# Patient Record
Sex: Female | Born: 1945 | Race: White | Hispanic: No | Smoking: Current every day smoker
Health system: Southern US, Community
[De-identification: ages and names within clinical notes are randomized; demographics above are authoritative.]

## PROBLEM LIST (undated history)

## (undated) DIAGNOSIS — I10 Essential (primary) hypertension: Secondary | ICD-10-CM

## (undated) DIAGNOSIS — J449 Chronic obstructive pulmonary disease, unspecified: Secondary | ICD-10-CM

## (undated) HISTORY — PX: CHOLECYSTECTOMY: SHX55

## (undated) HISTORY — PX: NECK SURGERY: SHX720

## (undated) HISTORY — PX: COLONOSCOPY: SHX174

---

## 2011-02-21 DIAGNOSIS — J449 Chronic obstructive pulmonary disease, unspecified: Secondary | ICD-10-CM | POA: Diagnosis present

## 2011-02-28 DIAGNOSIS — E785 Hyperlipidemia, unspecified: Secondary | ICD-10-CM | POA: Diagnosis present

## 2011-09-27 DIAGNOSIS — K5730 Diverticulosis of large intestine without perforation or abscess without bleeding: Secondary | ICD-10-CM | POA: Insufficient documentation

## 2012-08-16 DIAGNOSIS — Z22322 Carrier or suspected carrier of Methicillin resistant Staphylococcus aureus: Secondary | ICD-10-CM | POA: Insufficient documentation

## 2012-11-17 DIAGNOSIS — F411 Generalized anxiety disorder: Secondary | ICD-10-CM | POA: Insufficient documentation

## 2016-05-06 DIAGNOSIS — G459 Transient cerebral ischemic attack, unspecified: Secondary | ICD-10-CM | POA: Insufficient documentation

## 2016-05-06 DIAGNOSIS — N1832 Chronic kidney disease, stage 3b: Secondary | ICD-10-CM | POA: Diagnosis present

## 2017-04-14 DIAGNOSIS — H16223 Keratoconjunctivitis sicca, not specified as Sjogren's, bilateral: Secondary | ICD-10-CM | POA: Insufficient documentation

## 2017-08-30 DIAGNOSIS — H9313 Tinnitus, bilateral: Secondary | ICD-10-CM | POA: Insufficient documentation

## 2017-08-30 DIAGNOSIS — K219 Gastro-esophageal reflux disease without esophagitis: Secondary | ICD-10-CM | POA: Diagnosis present

## 2018-09-13 DIAGNOSIS — M47816 Spondylosis without myelopathy or radiculopathy, lumbar region: Secondary | ICD-10-CM | POA: Insufficient documentation

## 2019-03-19 DIAGNOSIS — I5189 Other ill-defined heart diseases: Secondary | ICD-10-CM

## 2019-04-12 DIAGNOSIS — I889 Nonspecific lymphadenitis, unspecified: Secondary | ICD-10-CM | POA: Insufficient documentation

## 2019-04-12 DIAGNOSIS — F17210 Nicotine dependence, cigarettes, uncomplicated: Secondary | ICD-10-CM | POA: Diagnosis present

## 2019-10-22 DIAGNOSIS — H353131 Nonexudative age-related macular degeneration, bilateral, early dry stage: Secondary | ICD-10-CM | POA: Insufficient documentation

## 2020-02-04 DIAGNOSIS — R221 Localized swelling, mass and lump, neck: Secondary | ICD-10-CM | POA: Insufficient documentation

## 2020-05-14 DIAGNOSIS — K118 Other diseases of salivary glands: Secondary | ICD-10-CM | POA: Insufficient documentation

## 2021-05-20 DIAGNOSIS — I2510 Atherosclerotic heart disease of native coronary artery without angina pectoris: Secondary | ICD-10-CM | POA: Diagnosis present

## 2021-11-04 DIAGNOSIS — J40 Bronchitis, not specified as acute or chronic: Secondary | ICD-10-CM | POA: Insufficient documentation

## 2021-11-10 ENCOUNTER — Other Ambulatory Visit: Payer: Self-pay

## 2021-11-10 ENCOUNTER — Encounter (HOSPITAL_BASED_OUTPATIENT_CLINIC_OR_DEPARTMENT_OTHER): Payer: Self-pay | Admitting: Urology

## 2021-11-10 ENCOUNTER — Emergency Department (HOSPITAL_BASED_OUTPATIENT_CLINIC_OR_DEPARTMENT_OTHER): Payer: Medicare HMO

## 2021-11-10 ENCOUNTER — Emergency Department (HOSPITAL_BASED_OUTPATIENT_CLINIC_OR_DEPARTMENT_OTHER)
Admission: EM | Admit: 2021-11-10 | Discharge: 2021-11-11 | Disposition: A | Discharge status: Home / Self Care | Payer: Medicare HMO | Attending: Emergency Medicine | Admitting: Emergency Medicine

## 2021-11-10 DIAGNOSIS — R0602 Shortness of breath: Secondary | ICD-10-CM | POA: Diagnosis present

## 2021-11-10 DIAGNOSIS — D649 Anemia, unspecified: Secondary | ICD-10-CM | POA: Diagnosis not present

## 2021-11-10 DIAGNOSIS — J449 Chronic obstructive pulmonary disease, unspecified: Secondary | ICD-10-CM | POA: Insufficient documentation

## 2021-11-10 DIAGNOSIS — Z7901 Long term (current) use of anticoagulants: Secondary | ICD-10-CM | POA: Diagnosis not present

## 2021-11-10 DIAGNOSIS — I10 Essential (primary) hypertension: Secondary | ICD-10-CM | POA: Diagnosis not present

## 2021-11-10 DIAGNOSIS — R072 Precordial pain: Secondary | ICD-10-CM | POA: Diagnosis not present

## 2021-11-10 DIAGNOSIS — E871 Hypo-osmolality and hyponatremia: Secondary | ICD-10-CM | POA: Diagnosis not present

## 2021-11-10 DIAGNOSIS — I480 Paroxysmal atrial fibrillation: Secondary | ICD-10-CM | POA: Diagnosis not present

## 2021-11-10 DIAGNOSIS — R002 Palpitations: Secondary | ICD-10-CM

## 2021-11-10 HISTORY — DX: Chronic obstructive pulmonary disease, unspecified: J44.9

## 2021-11-10 HISTORY — DX: Essential (primary) hypertension: I10

## 2021-11-10 LAB — CBC
HCT: 24 % — ABNORMAL LOW (ref 36.0–46.0)
Hemoglobin: 8.2 g/dL — ABNORMAL LOW (ref 12.0–15.0)
MCH: 32.5 pg (ref 26.0–34.0)
MCHC: 34.2 g/dL (ref 30.0–36.0)
MCV: 95.2 fL (ref 80.0–100.0)
Platelets: 339 10*3/uL (ref 150–400)
RBC: 2.52 MIL/uL — ABNORMAL LOW (ref 3.87–5.11)
RDW: 15.9 % — ABNORMAL HIGH (ref 11.5–15.5)
WBC: 10.6 10*3/uL — ABNORMAL HIGH (ref 4.0–10.5)
nRBC: 0 % (ref 0.0–0.2)

## 2021-11-10 LAB — BASIC METABOLIC PANEL
Anion gap: 6 (ref 5–15)
BUN: 19 mg/dL (ref 8–23)
CO2: 22 mmol/L (ref 22–32)
Calcium: 8.5 mg/dL — ABNORMAL LOW (ref 8.9–10.3)
Chloride: 99 mmol/L (ref 98–111)
Creatinine, Ser: 1.91 mg/dL — ABNORMAL HIGH (ref 0.44–1.00)
GFR, Estimated: 27 mL/min — ABNORMAL LOW (ref >60)
Glucose, Bld: 95 mg/dL (ref 70–99)
Potassium: 4.3 mmol/L (ref 3.5–5.1)
Sodium: 127 mmol/L — ABNORMAL LOW (ref 135–145)

## 2021-11-10 LAB — MAGNESIUM: Magnesium: 2.1 mg/dL (ref 1.7–2.4)

## 2021-11-10 LAB — TROPONIN I (HIGH SENSITIVITY)
Troponin I (High Sensitivity): 7 ng/L (ref <18)
Troponin I (High Sensitivity): 7 ng/L (ref <18)

## 2021-11-10 MED ORDER — SODIUM CHLORIDE 0.9 % IV BOLUS
500.0000 mL | Freq: Once | INTRAVENOUS | Status: AC
  Administered 2021-11-10: 500 mL via INTRAVENOUS

## 2021-11-10 NOTE — ED Triage Notes (Signed)
Central chest pain and SOB x 45 minutes  Worse with exertion  States "felt faint when walking"  States pain radiating to back and jaw H/o irregualar HR , takes eliquis, started within the last month  States bilateral leg pain

## 2021-11-10 NOTE — ED Notes (Signed)
Patient transported to X-ray 

## 2021-11-11 NOTE — ED Provider Notes (Signed)
Emergency Department Provider Note   I have reviewed the triage vital signs and the nursing notes.   HISTORY  Chief Complaint Chest Pain   HPI Kristina Olson is a 76 y.o. female with PMH of COPD, HTN, and PAF on Eliquis presents to the ED with central chest discomfort, shortness of breath, heart palpitations.  Symptoms recurred today but notes that she has had similar episodes in the recent past.  She was diagnosed with a cardiac arrhythmia, she believes to be A-fib, and it feels similar to today's symptoms.  She not actively having any discomfort.  She is compliant with her Eliquis.  She states often when she is up and walking she feels weakness in the legs and then palpitations and discomfort develop in the chest.  No prior history of ACS.    Past Medical History:  Diagnosis Date   COPD (chronic obstructive pulmonary disease) ([place])    Hypertension     Review of Systems  Constitutional: No fever/chills Eyes: No visual changes. ENT: No sore throat. Cardiovascular: Positive chest pain w/ palpitations.  Respiratory: Positive shortness of breath. Gastrointestinal: No abdominal pain.  No nausea, no vomiting.  No diarrhea.  No constipation. Musculoskeletal: Negative for back pain. Skin: Negative for rash. Neurological: Negative for headaches.  ____________________________________________   PHYSICAL EXAM:  VITAL SIGNS: ED Triage Vitals  Enc Vitals Group     BP 11/10/21 2120 (!) 143/71     Pulse Rate 11/10/21 2120 88     Resp 11/10/21 2120 20     Temp 11/10/21 2120 98.9 F (37.2 C)     Temp Source 11/10/21 2120 Oral     SpO2 11/10/21 2120 98 %     Weight 11/10/21 2119 115 lb (52.2 kg)     Height 11/10/21 2119 5\' 2"  (1.575 m)   Constitutional: Alert and oriented. Well appearing and in no acute distress. Eyes: Conjunctivae are normal.  Head: Atraumatic. Nose: No congestion/rhinnorhea. Mouth/Throat: Mucous membranes are moist.   Neck: No stridor.   Cardiovascular:  Normal rate, regular rhythm. Good peripheral circulation. Grossly normal heart sounds. 2+ DP and PT pulses bilaterally. Respiratory: Normal respiratory effort.  No retractions. Lungs CTAB. No wheezing.  Gastrointestinal: Soft and nontender. No distention.  Musculoskeletal: No lower extremity tenderness nor edema. No gross deformities of extremities. Neurologic:  Normal speech and language. No gross focal neurologic deficits are appreciated.  Skin:  Skin is warm, dry and intact. No rash noted.   ____________________________________________   LABS (all labs ordered are listed, but only abnormal results are displayed)  Labs Reviewed  BASIC METABOLIC PANEL - Abnormal; Notable for the following components:      Result Value   Sodium 127 (*)    Creatinine, Ser 1.91 (*)    Calcium 8.5 (*)    GFR, Estimated 27 (*)    All other components within normal limits  CBC - Abnormal; Notable for the following components:   WBC 10.6 (*)    RBC 2.52 (*)    Hemoglobin 8.2 (*)    HCT 24.0 (*)    RDW 15.9 (*)    All other components within normal limits  MAGNESIUM  TROPONIN I (HIGH SENSITIVITY)  TROPONIN I (HIGH SENSITIVITY)   ____________________________________________  EKG   EKG Interpretation  Date/Time:  Wednesday Nov 10 2021 21:26:53 EDT Ventricular Rate:  82 PR Interval:  184 QRS Duration: 80 QT Interval:  390 QTC Calculation: 455 R Axis:   73 Text Interpretation: Normal sinus rhythm Normal  ECG No previous ECGs available Confirmed by Nanda Quinton [phone]) on 11/10/2021 10:41:02 PM        ____________________________________________  RADIOLOGY  DG Chest 2 View  Result Date: 11/10/2021 CLINICAL DATA:  chest pain EXAM: CHEST - 2 VIEW COMPARISON:  Chest x-ray 11/04/2021, CT chest 05/17/2021 FINDINGS: The heart and mediastinal contours are unchanged. Aortic calcification. Biapical pleural/pulmonary scarring. No focal consolidation. No pulmonary edema. No pleural effusion. No  pneumothorax. No acute osseous abnormality. IMPRESSION: 1. No active cardiopulmonary disease. 2.  Aortic Atherosclerosis (ICD10-I70.0). Electronically Signed   By: Iven Finn M.D.   On: 11/10/2021 21:50    ____________________________________________   PROCEDURES  Procedure(s) performed:   Procedures  None  ____________________________________________   INITIAL IMPRESSION / ASSESSMENT AND PLAN / ED COURSE  Pertinent labs & imaging results that were available during my care of the patient were reviewed by me and considered in my medical decision making (see chart for details).   This patient is Presenting for Evaluation of CP, which does require a range of treatment options, and is a complaint that involves a high risk of morbidity and mortality.  The Differential Diagnoses includes but is not exclusive to acute coronary syndrome, aortic dissection, pulmonary embolism, cardiac tamponade, community-acquired pneumonia, pericarditis, musculoskeletal chest wall pain, etc.   Critical Interventions-    Medications  sodium chloride 0.9 % bolus 500 mL (0 mLs Intravenous Stopped 11/11/21 0053)    Reassessment after intervention: Patient feeling well.    I decided to review pertinent External Data, and in summary patient followed primarily in the [facility] system. Most recent CBC and chemistry evaluated. .   Clinical Laboratory Tests Ordered, included patient's labs are consistent with creatinine of 1.91.  Noted to have CKD prior labs from the [facility] system.  Very mild hyponatremia at 127.  Troponin is negative x2.  Hemoglobin of 8.2 downtrending slightly from 9 labs from earlier this month.   Radiologic Tests Ordered, included CXR. I independently interpreted the images and agree with radiology interpretation.   Cardiac Monitor Tracing which shows NSR. No ectopy. No a fib.   Medical Decision Making: Summary: Patient presents emergency department intermittent palpitation,  weakness, shortness of breath symptoms.  She has associated chest discomfort and notes that this similar to prior episodes of arrhythmia.  Low suspicion overall for ACS although patient does have several risk factors.  Her troponin is negative x2 and she is not having any active pain.   Reevaluation with update and discussion with patient.  We discussed her lab results and need for close follow-up regarding her anemia.  She is feeling much better after IV fluids.  Does not appear acutely volume overloaded to suspect CHF leading to her hyponatremia.  She is not symptomatic from her hyponatremia.  I feel both of these issues can be addressed as an outpatient.   Considered admission but patient has close follow-up with her PCP in the [facility] along with a cardiologist.  We discussed strict ED return precautions the patient feels comfortable with discharge home and close follow-up.   Disposition: discharge  ____________________________________________  FINAL CLINICAL IMPRESSION(S) / ED DIAGNOSES  Final diagnoses:  Precordial chest pain  Palpitations  Hyponatremia  Anemia, unspecified type    Note:  This document was prepared using Dragon voice recognition software and may include unintentional dictation errors.  Nanda Quinton, MD, [facility] Emergency Medicine    Renalda Locklin, [facility], MD 11/11/21 [phone]

## 2021-11-11 NOTE — ED Notes (Signed)
Pt A&OX4 ambulatory at d/c with independent steady gait. Pt is calling her son to come pick her up. Pt verbalized understanding of d/c instructions and follow up care.

## 2021-11-11 NOTE — Discharge Instructions (Signed)
You were seen in the emergency department today with chest discomfort.  Your lab work today shows slightly low sodium levels, possibly from dehydration.  I would like for you to follow with your primary care doctor for repeat lab work in the next 1 week.  They can check to see this is returned to normal.  Your hemoglobin or red blood cell levels were also low but not to the point of requiring a blood transfusion.  Your primary care doctor will need to repeat these blood labs as well.  If this continues to drop lower you may feel fatigue symptoms or require blood transfusion.   Please call your cardiologist in the morning to discuss your symptoms today and schedule a close follow-up with them as well.  Return to the emergency department with any new or suddenly worsening symptoms.

## 2021-11-19 DIAGNOSIS — D500 Iron deficiency anemia secondary to blood loss (chronic): Secondary | ICD-10-CM | POA: Insufficient documentation

## 2021-11-27 ENCOUNTER — Other Ambulatory Visit: Payer: Self-pay

## 2021-11-27 ENCOUNTER — Inpatient Hospital Stay (HOSPITAL_BASED_OUTPATIENT_CLINIC_OR_DEPARTMENT_OTHER)
Admission: EM | Admit: 2021-11-27 | Discharge: 2021-11-29 | DRG: 378 | Disposition: A | Discharge status: Home / Self Care | Payer: Medicare HMO | Attending: Internal Medicine | Admitting: Internal Medicine

## 2021-11-27 ENCOUNTER — Emergency Department (HOSPITAL_BASED_OUTPATIENT_CLINIC_OR_DEPARTMENT_OTHER): Payer: Medicare HMO

## 2021-11-27 ENCOUNTER — Encounter (HOSPITAL_BASED_OUTPATIENT_CLINIC_OR_DEPARTMENT_OTHER): Payer: Self-pay | Admitting: Emergency Medicine

## 2021-11-27 DIAGNOSIS — R0602 Shortness of breath: Secondary | ICD-10-CM | POA: Diagnosis present

## 2021-11-27 DIAGNOSIS — Z7982 Long term (current) use of aspirin: Secondary | ICD-10-CM

## 2021-11-27 DIAGNOSIS — Z5329 Procedure and treatment not carried out because of patient's decision for other reasons: Secondary | ICD-10-CM | POA: Diagnosis not present

## 2021-11-27 DIAGNOSIS — E871 Hypo-osmolality and hyponatremia: Secondary | ICD-10-CM | POA: Diagnosis present

## 2021-11-27 DIAGNOSIS — Z8719 Personal history of other diseases of the digestive system: Secondary | ICD-10-CM

## 2021-11-27 DIAGNOSIS — I129 Hypertensive chronic kidney disease with stage 1 through stage 4 chronic kidney disease, or unspecified chronic kidney disease: Secondary | ICD-10-CM | POA: Diagnosis present

## 2021-11-27 DIAGNOSIS — Z7901 Long term (current) use of anticoagulants: Secondary | ICD-10-CM

## 2021-11-27 DIAGNOSIS — Z8249 Family history of ischemic heart disease and other diseases of the circulatory system: Secondary | ICD-10-CM | POA: Diagnosis not present

## 2021-11-27 DIAGNOSIS — Z9049 Acquired absence of other specified parts of digestive tract: Secondary | ICD-10-CM

## 2021-11-27 DIAGNOSIS — K219 Gastro-esophageal reflux disease without esophagitis: Secondary | ICD-10-CM | POA: Diagnosis present

## 2021-11-27 DIAGNOSIS — D539 Nutritional anemia, unspecified: Secondary | ICD-10-CM | POA: Diagnosis present

## 2021-11-27 DIAGNOSIS — K921 Melena: Secondary | ICD-10-CM | POA: Diagnosis not present

## 2021-11-27 DIAGNOSIS — I480 Paroxysmal atrial fibrillation: Secondary | ICD-10-CM | POA: Diagnosis present

## 2021-11-27 DIAGNOSIS — K2100 Gastro-esophageal reflux disease with esophagitis, without bleeding: Secondary | ICD-10-CM | POA: Diagnosis not present

## 2021-11-27 DIAGNOSIS — Z801 Family history of malignant neoplasm of trachea, bronchus and lung: Secondary | ICD-10-CM

## 2021-11-27 DIAGNOSIS — K580 Irritable bowel syndrome with diarrhea: Secondary | ICD-10-CM | POA: Diagnosis present

## 2021-11-27 DIAGNOSIS — I2510 Atherosclerotic heart disease of native coronary artery without angina pectoris: Secondary | ICD-10-CM | POA: Diagnosis present

## 2021-11-27 DIAGNOSIS — N1832 Chronic kidney disease, stage 3b: Secondary | ICD-10-CM | POA: Diagnosis present

## 2021-11-27 DIAGNOSIS — I10 Essential (primary) hypertension: Secondary | ICD-10-CM | POA: Diagnosis not present

## 2021-11-27 DIAGNOSIS — Z885 Allergy status to narcotic agent status: Secondary | ICD-10-CM

## 2021-11-27 DIAGNOSIS — D62 Acute posthemorrhagic anemia: Secondary | ICD-10-CM | POA: Diagnosis present

## 2021-11-27 DIAGNOSIS — I2584 Coronary atherosclerosis due to calcified coronary lesion: Secondary | ICD-10-CM | POA: Diagnosis present

## 2021-11-27 DIAGNOSIS — F17210 Nicotine dependence, cigarettes, uncomplicated: Secondary | ICD-10-CM | POA: Diagnosis present

## 2021-11-27 DIAGNOSIS — K922 Gastrointestinal hemorrhage, unspecified: Secondary | ICD-10-CM

## 2021-11-27 DIAGNOSIS — E872 Acidosis, unspecified: Secondary | ICD-10-CM | POA: Diagnosis present

## 2021-11-27 DIAGNOSIS — Z91040 Latex allergy status: Secondary | ICD-10-CM | POA: Diagnosis not present

## 2021-11-27 DIAGNOSIS — Z825 Family history of asthma and other chronic lower respiratory diseases: Secondary | ICD-10-CM | POA: Diagnosis not present

## 2021-11-27 DIAGNOSIS — D649 Anemia, unspecified: Principal | ICD-10-CM

## 2021-11-27 DIAGNOSIS — Z888 Allergy status to other drugs, medicaments and biological substances status: Secondary | ICD-10-CM

## 2021-11-27 DIAGNOSIS — I5189 Other ill-defined heart diseases: Secondary | ICD-10-CM | POA: Diagnosis not present

## 2021-11-27 DIAGNOSIS — Z79899 Other long term (current) drug therapy: Secondary | ICD-10-CM

## 2021-11-27 DIAGNOSIS — E8809 Other disorders of plasma-protein metabolism, not elsewhere classified: Secondary | ICD-10-CM | POA: Diagnosis present

## 2021-11-27 DIAGNOSIS — J449 Chronic obstructive pulmonary disease, unspecified: Secondary | ICD-10-CM | POA: Diagnosis present

## 2021-11-27 DIAGNOSIS — K2971 Gastritis, unspecified, with bleeding: Secondary | ICD-10-CM | POA: Diagnosis present

## 2021-11-27 DIAGNOSIS — E785 Hyperlipidemia, unspecified: Secondary | ICD-10-CM | POA: Diagnosis present

## 2021-11-27 DIAGNOSIS — Z833 Family history of diabetes mellitus: Secondary | ICD-10-CM

## 2021-11-27 DIAGNOSIS — R079 Chest pain, unspecified: Secondary | ICD-10-CM

## 2021-11-27 DIAGNOSIS — Z20822 Contact with and (suspected) exposure to covid-19: Secondary | ICD-10-CM | POA: Diagnosis present

## 2021-11-27 DIAGNOSIS — Z83438 Family history of other disorder of lipoprotein metabolism and other lipidemia: Secondary | ICD-10-CM

## 2021-11-27 DIAGNOSIS — F419 Anxiety disorder, unspecified: Secondary | ICD-10-CM | POA: Diagnosis present

## 2021-11-27 LAB — CBC WITH DIFFERENTIAL/PLATELET
Abs Immature Granulocytes: 0.09 10*3/uL — ABNORMAL HIGH (ref 0.00–0.07)
Basophils Absolute: 0 10*3/uL (ref 0.0–0.1)
Basophils Relative: 0 %
Eosinophils Absolute: 0.2 10*3/uL (ref 0.0–0.5)
Eosinophils Relative: 3 %
HCT: 18.8 % — ABNORMAL LOW (ref 36.0–46.0)
Hemoglobin: 6.2 g/dL — CL (ref 12.0–15.0)
Immature Granulocytes: 1 %
Lymphocytes Relative: 32 %
Lymphs Abs: 3.1 10*3/uL (ref 0.7–4.0)
MCH: 33.2 pg (ref 26.0–34.0)
MCHC: 33 g/dL (ref 30.0–36.0)
MCV: 100.5 fL — ABNORMAL HIGH (ref 80.0–100.0)
Monocytes Absolute: 0.8 10*3/uL (ref 0.1–1.0)
Monocytes Relative: 8 %
Neutro Abs: 5.4 10*3/uL (ref 1.7–7.7)
Neutrophils Relative %: 56 %
Platelets: 373 10*3/uL (ref 150–400)
RBC: 1.87 MIL/uL — ABNORMAL LOW (ref 3.87–5.11)
RDW: 17 % — ABNORMAL HIGH (ref 11.5–15.5)
WBC: 9.6 10*3/uL (ref 4.0–10.5)
nRBC: 0 % (ref 0.0–0.2)

## 2021-11-27 LAB — COMPREHENSIVE METABOLIC PANEL
ALT: 12 U/L (ref 0–44)
AST: 16 U/L (ref 15–41)
Albumin: 3.2 g/dL — ABNORMAL LOW (ref 3.5–5.0)
Alkaline Phosphatase: 63 U/L (ref 38–126)
Anion gap: 5 (ref 5–15)
BUN: 37 mg/dL — ABNORMAL HIGH (ref 8–23)
CO2: 23 mmol/L (ref 22–32)
Calcium: 8.4 mg/dL — ABNORMAL LOW (ref 8.9–10.3)
Chloride: 99 mmol/L (ref 98–111)
Creatinine, Ser: 1.96 mg/dL — ABNORMAL HIGH (ref 0.44–1.00)
GFR, Estimated: 26 mL/min — ABNORMAL LOW (ref >60)
Glucose, Bld: 90 mg/dL (ref 70–99)
Potassium: 4.6 mmol/L (ref 3.5–5.1)
Sodium: 127 mmol/L — ABNORMAL LOW (ref 135–145)
Total Bilirubin: 0.3 mg/dL (ref 0.3–1.2)
Total Protein: 6.3 g/dL — ABNORMAL LOW (ref 6.5–8.1)

## 2021-11-27 LAB — OCCULT BLOOD X 1 CARD TO LAB, STOOL: Fecal Occult Bld: NEGATIVE

## 2021-11-27 LAB — SARS CORONAVIRUS 2 BY RT PCR: SARS Coronavirus 2 by RT PCR: NEGATIVE

## 2021-11-27 LAB — PROTIME-INR
INR: 1.2 (ref 0.8–1.2)
Prothrombin Time: 15 seconds (ref 11.4–15.2)

## 2021-11-27 LAB — HEMOGLOBIN AND HEMATOCRIT, BLOOD
HCT: 18 % — ABNORMAL LOW (ref 36.0–46.0)
Hemoglobin: 5.7 g/dL — CL (ref 12.0–15.0)

## 2021-11-27 LAB — LIPASE, BLOOD: Lipase: 53 U/L — ABNORMAL HIGH (ref 11–51)

## 2021-11-27 LAB — BRAIN NATRIURETIC PEPTIDE: B Natriuretic Peptide: 289.9 pg/mL — ABNORMAL HIGH (ref 0.0–100.0)

## 2021-11-27 LAB — SODIUM, URINE, RANDOM: Sodium, Ur: 53 mmol/L

## 2021-11-27 LAB — PREPARE RBC (CROSSMATCH)

## 2021-11-27 LAB — TROPONIN I (HIGH SENSITIVITY): Troponin I (High Sensitivity): 6 ng/L (ref <18)

## 2021-11-27 LAB — ABO/RH: ABO/RH(D): B POS

## 2021-11-27 MED ORDER — LORAZEPAM 0.5 MG PO TABS: 0.5000 mg | ORAL_TABLET | ORAL | Status: DC

## 2021-11-27 MED ORDER — LORAZEPAM 0.5 MG PO TABS
0.5000 mg | ORAL_TABLET | Freq: Every day | ORAL | Status: DC
  Administered 2021-11-27: 0.5 mg via ORAL
  Administered 2021-11-28: 1 mg via ORAL
  Filled 2021-11-27: qty 1
  Filled 2021-11-27: qty 2

## 2021-11-27 MED ORDER — PANTOPRAZOLE SODIUM 40 MG IV SOLR: 40.0000 mg | Freq: Two times a day (BID) | INTRAVENOUS | Status: DC

## 2021-11-27 MED ORDER — PANTOPRAZOLE 80MG IVPB - SIMPLE MED
80.0000 mg | Freq: Once | INTRAVENOUS | Status: AC
  Administered 2021-11-27: 80 mg via INTRAVENOUS
  Filled 2021-11-27: qty 100

## 2021-11-27 MED ORDER — ALBUTEROL SULFATE HFA 108 (90 BASE) MCG/ACT IN AERS: 1.0000 | INHALATION_SPRAY | RESPIRATORY_TRACT | Status: DC

## 2021-11-27 MED ORDER — ONDANSETRON HCL 4 MG PO TABS: 4.0000 mg | ORAL_TABLET | Freq: Four times a day (QID) | ORAL | Status: DC | PRN

## 2021-11-27 MED ORDER — SODIUM CHLORIDE 0.9% IV SOLUTION: Freq: Once | INTRAVENOUS | Status: DC

## 2021-11-27 MED ORDER — PANTOPRAZOLE SODIUM 40 MG IV SOLR
INTRAVENOUS | Status: AC
  Filled 2021-11-27: qty 20

## 2021-11-27 MED ORDER — LORAZEPAM 1 MG PO TABS: 1.0000 mg | ORAL_TABLET | Freq: Every day | ORAL | Status: DC | PRN

## 2021-11-27 MED ORDER — ALBUTEROL SULFATE (2.5 MG/3ML) 0.083% IN NEBU: 2.5000 mg | INHALATION_SOLUTION | RESPIRATORY_TRACT | Status: DC | PRN

## 2021-11-27 MED ORDER — PANTOPRAZOLE INFUSION (NEW) - SIMPLE MED
8.0000 mg/h | INTRAVENOUS | Status: DC
  Administered 2021-11-27 – 2021-11-29 (×6): 8 mg/h via INTRAVENOUS
  Filled 2021-11-27 (×2): qty 100
  Filled 2021-11-27 (×2): qty 80
  Filled 2021-11-27 (×2): qty 100
  Filled 2021-11-27 (×2): qty 80

## 2021-11-27 MED ORDER — SODIUM CHLORIDE 0.9 % IV SOLN: 10.0000 mL/h | Freq: Once | INTRAVENOUS | Status: DC

## 2021-11-27 MED ORDER — ONDANSETRON HCL 4 MG/2ML IJ SOLN
4.0000 mg | Freq: Four times a day (QID) | INTRAMUSCULAR | Status: DC | PRN
  Administered 2021-11-28: 4 mg via INTRAVENOUS
  Filled 2021-11-27: qty 2

## 2021-11-27 MED ORDER — ONDANSETRON HCL 4 MG/2ML IJ SOLN
4.0000 mg | Freq: Once | INTRAMUSCULAR | Status: AC
  Administered 2021-11-27: 4 mg via INTRAVENOUS
  Filled 2021-11-27: qty 2

## 2021-11-27 NOTE — Consult Note (Signed)
Referring Provider:  EDP Primary Care Physician:  Pcp, No Primary Gastroenterologist: Unassigned/digestive health  Reason for Consultation: Worsening anemia, GI bleed  HPI: Kristina Olson is a 76 y.o. female with past medical history of A-fib who was recently started on Xarelto, history of COPD, chronic kidney disease, coronary artery disease, Warthin's tumor presented [facility] with shortness of breath, chest pain, fatigue, nausea and vomiting.  Upon initial evaluation, he was found to have worsening anemia with hemoglobin of 6.2.  His hemoglobin was 9.1 on Nov 08, 2021.  Looks like he was seen by digestive health GI group few days ago for worsening anemia and they were planning to do outpatient EGD and colonoscopy.  Patient seen and examined at bedside.  Patient with longstanding history of IBS diarrhea requiring use of as needed dicyclomine.  According to patient, she started having constipation after taking Eliquis/Xarelto.  She had no bowel movement for 1 week subsequently requiring use of MiraLAX and over-the-counter laxative.  When she had a bowel movement it was black-colored followed by multiple episodes of diarrhea.  Complaining of generalized abdominal discomfort after meals which gets better with bowel movement.  Denies nausea or vomiting.  Denies NSAID use.  Denies bright red blood per rectum.  She is scheduled for outpatient EGD and colonoscopy July 5 with digestive health.  Past Medical History:  Diagnosis Date   COPD (chronic obstructive pulmonary disease) ([facility])    Hypertension     Past Surgical History:  Procedure Laterality Date   CHOLECYSTECTOMY     COLONOSCOPY     August, 2020   NECK SURGERY     5-7 years ago.    Prior to Admission medications   Medication Sig Start Date End Date Taking? Authorizing Provider  amLODipine (NORVASC) 5 MG tablet Take 5 mg by mouth daily.    [provider]    Scheduled Meds:  sodium chloride   Intravenous Once    LORazepam  0.5-1 mg Oral QHS   [START ON 11/30/2021] pantoprazole  40 mg Intravenous Q12H   Continuous Infusions:  sodium chloride     pantoprazole 8 mg/hr (11/28/21 0643)   PRN Meds:.  Allergies as of 11/27/2021 - Review Complete 11/27/2021  Allergen Reaction Noted   Gabapentin Shortness Of Breath and Other (See Comments) 02/18/2011   Gold Itching 01/25/2019   Prednisone Shortness Of Breath, Palpitations, and Other (See Comments) 04/01/2016   Apixaban Other (See Comments) 11/19/2021   Acetaminophen Diarrhea 02/18/2011   Codeine Nausea And Vomiting and Other (See Comments) 02/18/2011   Ibuprofen Diarrhea 08/09/2012   Latex Rash 11/27/2021   Tape Rash and Other (See Comments) 11/27/2021    Family History  Problem Relation Age of Onset   Irritable bowel syndrome Mother    Atrial fibrillation Mother    Hypertension Mother    Emphysema Father    Cirrhosis Father    Ulcers Father    Heart disease Sister    Hyperlipidemia Sister    Hypertension Sister    Atrial fibrillation Brother    Hypertension Brother    Hyperlipidemia Brother    Diabetes Mellitus II Brother    Heart disease Brother    Lung cancer Brother    Crohn's disease Nephew     Social History   Socioeconomic History   Marital status: Unknown    Spouse name: Not on file   Number of children: Not on file   Years of education: Not on file   Highest education level:   Not on file  Occupational History   Not on file  Tobacco Use   Smoking status: Every Day    Packs/day: 1.00    Years: 14.00    Total pack years: 14.00    Types: Cigarettes   Smokeless tobacco: Never  Substance and Sexual Activity   Alcohol use: Never   Drug use: Never   Sexual activity: Not on file  Other Topics Concern   Not on file  Social History Narrative   Not on file   Social Determinants of Health   Financial Resource Strain: Not on file  Food Insecurity: Not on file  Transportation Needs: Not on file  Physical Activity: Not  on file  Stress: Not on file  Social Connections: Not on file  Intimate Partner Violence: Not on file    Review of Systems: All negative except as stated above in HPI.  Physical Exam: Vital signs: Vitals:   11/28/21 0059 11/28/21 0608  BP: (!) 123/51 118/71  Pulse: 80 78  Resp: 16 18  Temp: 98.6 F (37 C) 98.3 F (36.8 C)  SpO2: 95% 95%   Last BM Date : 11/26/21 General:   Alert,  Well-developed, well-nourished, pleasant and cooperative in NAD Lungs:  Clear throughout to auscultation.   No wheezes, crackles, or rhonchi. No acute distress. Heart:  Regular rate and rhythm; no murmurs, clicks, rubs,  or gallops. Abdomen: Soft, nontender, nondistended, bowel sounds present, no peritoneal signs Rectal:  Deferred  GI:  Lab Results: Recent Labs    11/27/21 0914 11/27/21 1822 11/28/21 0503  WBC 9.6  --  8.1  HGB 6.2* 5.7* 8.3*  HCT 18.8* 18.0* 25.2*  PLT 373  --  320   BMET Recent Labs    11/27/21 0914 11/28/21 0503  NA 127* 132*  K 4.6 4.3  CL 99 103  CO2 23 22  GLUCOSE 90 86  BUN 37* 34*  CREATININE 1.96* 1.97*  CALCIUM 8.4* 8.7*   LFT Recent Labs    11/27/21 0914 11/28/21 0503  PROT 6.3*  --   ALBUMIN 3.2* 3.4*  AST 16  --   ALT 12  --   ALKPHOS 63  --   BILITOT 0.3  --    PT/INR Recent Labs    11/27/21 0914  LABPROT 15.0  INR 1.2     Studies/Results: DG Chest Portable 1 View  Result Date: 11/27/2021 CLINICAL DATA:  Shortness of breath and chest pain with radiation to the shoulders. Weakness. Nausea and vomiting. EXAM: PORTABLE CHEST 1 VIEW COMPARISON:  11/25/2021 FINDINGS: Lungs are adequately inflated and otherwise clear. Cardiomediastinal silhouette and remainder of the exam is unchanged. IMPRESSION: No active disease. Electronically Signed   By: Elberta Fortis M.D.   On: 11/27/2021 10:16    Impression/Plan: -Anemia with melena.  Need to rule out upper GI bleed.  HGB was down to 5.7.  Hemoglobin improved with blood transfusion. -Atrial  fibrillation.  Last dose of Xarelto on Friday night -History of diarrhea in a patient with cholecystectomy.  Could be bile salt diarrhea  Recommendations --------------------------- -Plan for EGD tomorrow -Okay to have soft diet today.  Keep n.p.o. past midnight. -Continue Protonix for now -She is scheduled for outpatient colonoscopy on July 5 with digestive health.  Risks (bleeding, infection, bowel perforation that could require surgery, sedation-related changes in cardiopulmonary systems), benefits (identification and possible treatment of source of symptoms, exclusion of certain causes of symptoms), and alternatives (watchful waiting, radiographic imaging studies, empiric medical treatment)  were explained to patient/family in detail and patient wishes to proceed.    LOS: 1 day   Kathi Der  MD, FACP 11/28/2021, 11:24 AM  Contact #  [phone]

## 2021-11-27 NOTE — ED Notes (Signed)
At bedside with Dr. Dalene Seltzer as a chaperone for exam.

## 2021-11-27 NOTE — H&P (View-Only) (Signed)
Referring Provider:  EDP Primary Care Physician:  Pcp, No Primary Gastroenterologist: Unassigned/digestive health  Reason for Consultation: Worsening anemia, GI bleed  HPI: Kristina Olson is a 76 y.o. female with past medical history of A-fib who was recently started on Xarelto, history of COPD, chronic kidney disease, coronary artery disease, Warthin's tumor presented to [facility] with shortness of breath, chest pain, fatigue, nausea and vomiting.  Upon initial evaluation, he was found to have worsening anemia with hemoglobin of 6.2.  His hemoglobin was 9.1 on Nov 08, 2021.  Looks like he was seen by digestive health GI group few days ago for worsening anemia and they were planning to do outpatient EGD and colonoscopy.  Patient seen and examined at bedside.  Patient with longstanding history of IBS diarrhea requiring use of as needed dicyclomine.  According to patient, she started having constipation after taking Eliquis/Xarelto.  She had no bowel movement for 1 week subsequently requiring use of MiraLAX and over-the-counter laxative.  When she had a bowel movement it was black-colored followed by multiple episodes of diarrhea.  Complaining of generalized abdominal discomfort after meals which gets better with bowel movement.  Denies nausea or vomiting.  Denies NSAID use.  Denies bright red blood per rectum.  She is scheduled for outpatient EGD and colonoscopy July 5 with digestive health.  Past Medical History:  Diagnosis Date   COPD (chronic obstructive pulmonary disease) ([place])    Hypertension     Past Surgical History:  Procedure Laterality Date   CHOLECYSTECTOMY     COLONOSCOPY     August, 2020   NECK SURGERY     5-7 years ago.    Prior to Admission medications   Medication Sig Start Date End Date Taking? Authorizing Provider  amLODipine (NORVASC) 5 MG tablet Take 5 mg by mouth daily.    [provider]    Scheduled Meds:  sodium chloride   Intravenous Once    LORazepam  0.5-1 mg Oral QHS   [START ON 11/30/2021] pantoprazole  40 mg Intravenous Q12H   Continuous Infusions:  sodium chloride     pantoprazole 8 mg/hr (11/28/21 0643)   PRN Meds:.  Allergies as of 11/27/2021 - Review Complete 11/27/2021  Allergen Reaction Noted   Gabapentin Shortness Of Breath and Other (See Comments) 02/18/2011   Gold Itching 01/25/2019   Prednisone Shortness Of Breath, Palpitations, and Other (See Comments) 04/01/2016   Apixaban Other (See Comments) 11/19/2021   Acetaminophen Diarrhea 02/18/2011   Codeine Nausea And Vomiting and Other (See Comments) 02/18/2011   Ibuprofen Diarrhea 08/09/2012   Latex Rash 11/27/2021   Tape Rash and Other (See Comments) 11/27/2021    Family History  Problem Relation Age of Onset   Irritable bowel syndrome Mother    Atrial fibrillation Mother    Hypertension Mother    Emphysema Father    Cirrhosis Father    Ulcers Father    Heart disease Sister    Hyperlipidemia Sister    Hypertension Sister    Atrial fibrillation Brother    Hypertension Brother    Hyperlipidemia Brother    Diabetes Mellitus II Brother    Heart disease Brother    Lung cancer Brother    Crohn's disease Nephew     Social History   Socioeconomic History   Marital status: Unknown    Spouse name: Not on file   Number of children: Not on file   Years of education: Not on file   Highest education level:  Not on file  Occupational History   Not on file  Tobacco Use   Smoking status: Every Day    Packs/day: 1.00    Years: 14.00    Total pack years: 14.00    Types: Cigarettes   Smokeless tobacco: Never  Substance and Sexual Activity   Alcohol use: Never   Drug use: Never   Sexual activity: Not on file  Other Topics Concern   Not on file  Social History Narrative   Not on file   Social Determinants of Health   Financial Resource Strain: Not on file  Food Insecurity: Not on file  Transportation Needs: Not on file  Physical Activity: Not  on file  Stress: Not on file  Social Connections: Not on file  Intimate Partner Violence: Not on file    Review of Systems: All negative except as stated above in HPI.  Physical Exam: Vital signs: Vitals:   11/28/21 0059 11/28/21 0608  BP: (!) 123/51 118/71  Pulse: 80 78  Resp: 16 18  Temp: 98.6 F (37 C) 98.3 F (36.8 C)  SpO2: 95% 95%   Last BM Date : 11/26/21 General:   Alert,  Well-developed, well-nourished, pleasant and cooperative in NAD Lungs:  Clear throughout to auscultation.   No wheezes, crackles, or rhonchi. No acute distress. Heart:  Regular rate and rhythm; no murmurs, clicks, rubs,  or gallops. Abdomen: Soft, nontender, nondistended, bowel sounds present, no peritoneal signs Rectal:  Deferred  GI:  Lab Results: Recent Labs    11/27/21 0914 11/27/21 1822 11/28/21 0503  WBC 9.6  --  8.1  HGB 6.2* 5.7* 8.3*  HCT 18.8* 18.0* 25.2*  PLT 373  --  320   BMET Recent Labs    11/27/21 0914 11/28/21 0503  NA 127* 132*  K 4.6 4.3  CL 99 103  CO2 23 22  GLUCOSE 90 86  BUN 37* 34*  CREATININE 1.96* 1.97*  CALCIUM 8.4* 8.7*   LFT Recent Labs    11/27/21 0914 11/28/21 0503  PROT 6.3*  --   ALBUMIN 3.2* 3.4*  AST 16  --   ALT 12  --   ALKPHOS 63  --   BILITOT 0.3  --    PT/INR Recent Labs    11/27/21 0914  LABPROT 15.0  INR 1.2     Studies/Results: DG Chest Portable 1 View  Result Date: 11/27/2021 CLINICAL DATA:  Shortness of breath and chest pain with radiation to the shoulders. Weakness. Nausea and vomiting. EXAM: PORTABLE CHEST 1 VIEW COMPARISON:  11/25/2021 FINDINGS: Lungs are adequately inflated and otherwise clear. Cardiomediastinal silhouette and remainder of the exam is unchanged. IMPRESSION: No active disease. Electronically Signed   By: Elberta Fortis M.D.   On: 11/27/2021 10:16    Impression/Plan: -Anemia with melena.  Need to rule out upper GI bleed.  HGB was down to 5.7.  Hemoglobin improved with blood transfusion. -Atrial  fibrillation.  Last dose of Xarelto on Friday night -History of diarrhea in a patient with cholecystectomy.  Could be bile salt diarrhea  Recommendations --------------------------- -Plan for EGD tomorrow -Okay to have soft diet today.  Keep n.p.o. past midnight. -Continue Protonix for now -She is scheduled for outpatient colonoscopy on July 5 with digestive health.  Risks (bleeding, infection, bowel perforation that could require surgery, sedation-related changes in cardiopulmonary systems), benefits (identification and possible treatment of source of symptoms, exclusion of certain causes of symptoms), and alternatives (watchful waiting, radiographic imaging studies, empiric medical treatment)  were explained to patient/family in detail and patient wishes to proceed.    LOS: 1 day   Kathi Der  MD, FACP 11/28/2021, 11:24 AM  Contact #  [phone]

## 2021-11-27 NOTE — H&P (Signed)
History and Physical    Patient: Kristina Olson DOB: 09/01/1945 DOA: 11/27/2021 DOS: the patient was seen and examined on 11/27/2021 PCP: Pcp, No  Patient coming from: Home  Chief Complaint:  Chief Complaint  Patient presents with   Chest Pain   HPI: Kristina Olson is a 76 y.o. female with medical history significant of paroxysmal atrial fibrillation, COPD, cigarette smoker, anxiety, stage IIIb CKD, colon polyps who is coming to the emergency department due to dyspnea, chest pressure radiating to the shoulders, progressively worse generalized weakness, history of nausea and vomiting, lower abdominal pain history of recent constipation followed by dark tarry looking stools several times after she was started on Eliquis for paroxysmal atrial fibrillation a few weeks ago.  No hematochezia.  She denied fever, chills, rhinorrhea, sore throat, wheezing or hemoptysis.  No chest pain, palpitations, diaphoresis, PND, orthopnea or pitting edema of the lower extremities.  No flank pain, dysuria, frequency or hematuria.  No polyuria, polydipsia, polyphagia or blurred vision.   ED course: Initial vital signs were temperature 98.3 F, pulse 74, respiration 19, blood pressure 141/71 mmHg.  The patient received ondansetron 4 mg IVP, pantoprazole 80 mg IVP.  Lab work: Her CBC is her white count 9.6, hemoglobin 6.2 g/dL and platelets 373.  2 weeks ago H&H was 8.2 g/dL.  Normal PT and INR.  CMP shows a sodium of 127, all other electrolytes are normal when calcium is corrected to albumin level.  BUN was 37, creatinine 1.96 mg/dL.  Total protein 6.3 and albumin 3.2 g/dL.  Glucose level and the rest of the LFTs were normal.  BNP 290 pg/mL.  Imaging: Portable 1 view chest radiograph was normal.   Review of Systems: As mentioned in the history of present illness. All other systems reviewed and are negative.  Past Medical History:  Diagnosis Date   COPD (chronic obstructive pulmonary disease) ([place])     Hypertension    Past Surgical History:  Procedure Laterality Date   CHOLECYSTECTOMY     COLONOSCOPY     August, 2020   NECK SURGERY     5-7 years ago.   Social History:  reports that she has been smoking cigarettes. She has a 14.00 pack-year smoking history. She has never used smokeless tobacco. She reports that she does not drink alcohol and does not use drugs.  Allergies  Allergen Reactions   Gabapentin Shortness Of Breath and Other (See Comments)    Allergic to high dose over 300 mg dose at one time    Gold Itching   Prednisone Shortness Of Breath, Palpitations and Other (See Comments)    Tachycardia (steroids)   Apixaban Other (See Comments)    EXTREME LEG PAIN   Acetaminophen Diarrhea   Codeine Nausea And Vomiting and Other (See Comments)    Causes extreme headaches, also   Ibuprofen Diarrhea   Latex Rash   Tape Rash and Other (See Comments)    PAPER tape only, please!!    Family History  Problem Relation Age of Onset   Irritable bowel syndrome Mother    Atrial fibrillation Mother    Hypertension Mother    Emphysema Father    Cirrhosis Father    Ulcers Father    Heart disease Sister    Hyperlipidemia Sister    Hypertension Sister    Atrial fibrillation Brother    Hypertension Brother    Hyperlipidemia Brother    Diabetes Mellitus II Brother    Heart disease Brother    Lung  cancer Brother    Crohn's disease Nephew     Prior to Admission medications   Medication Sig Start Date End Date Taking? Authorizing Provider  amLODipine (NORVASC) 5 MG tablet Take 5 mg by mouth daily.   Yes [provider]  aspirin EC 81 MG tablet Take 81 mg by mouth daily. Swallow whole.   Yes [provider]  Cholecalciferol (VITAMIN D-3 PO) Take 1 capsule by mouth daily.   Yes [provider]  Cyanocobalamin (VITAMIN B-12 PO) Take 1 tablet by mouth daily.   Yes [provider]  gemfibrozil (LOPID) 600 MG tablet Take 600 mg by mouth 2 (two) times  daily before a meal.   Yes [provider]  LORazepam (ATIVAN) 1 MG tablet Take 0.5-1 mg by mouth See admin instructions. Take 0.5-1 mg by mouth at bedtime and an additional 1 mg once a day as needed for anxiety 11/15/21  Yes [provider]  MAGNESIUM PO Take 1 tablet by mouth at bedtime as needed (for leg discomfort/pain/cramping).   Yes [provider]  metoprolol succinate (TOPROL-XL) 50 MG 24 hr tablet Take 50 mg by mouth 2 (two) times daily. Take with or immediately following a meal.   Yes [provider]  Omega-3 Fatty Acids (FISH OIL PO) Take 1-2 capsules by mouth daily.   Yes [provider]  omeprazole (PRILOSEC) 40 MG capsule Take 40 mg by mouth daily before breakfast.   Yes [provider]  ondansetron (ZOFRAN-ODT) 8 MG disintegrating tablet Take 8 mg by mouth daily as needed for nausea or vomiting (dissolve orally). 11/15/21  Yes [provider]  VENTOLIN HFA 108 (90 Base) MCG/ACT inhaler Inhale 1-2 puffs into the lungs See admin instructions. Inhale 1-2 puffs into the lungs every four to six hours as needed for shortness of breath or wheezing   Yes [provider]  XARELTO 20 MG TABS tablet Take 20 mg by mouth daily with supper. 11/11/21  Yes [provider]    Physical Exam: Vitals:   11/27/21 1400 11/27/21 1415 11/27/21 1615 11/27/21 1732  BP: 137/74 126/73 (!) 142/66 (!) 145/69  Pulse: 77 86  84  Resp: 16 18 18 18   Temp:   98.4 F (36.9 C) 98.5 F (36.9 C)  TempSrc:   Oral Oral  SpO2: 97% 90% 95% 99%  Weight:      Height:       Physical Exam Vitals and nursing note reviewed.  Constitutional:      Appearance: She is well-developed and normal weight. She is ill-appearing.  HENT:     Head: Normocephalic.     Mouth/Throat:     Mouth: Mucous membranes are moist.  Eyes:     General: No scleral icterus.    Pupils: Pupils are equal, round, and reactive to light.  Neck:     Vascular: No JVD.   Cardiovascular:     Rate and Rhythm: Normal rate and regular rhythm.  Pulmonary:     Breath sounds: Wheezing present. No rhonchi or rales.  Abdominal:     General: Bowel sounds are normal.     Palpations: Abdomen is soft.     Tenderness: There is abdominal tenderness in the left lower quadrant. There is no right CVA tenderness or left CVA tenderness.  Musculoskeletal:     Cervical back: Neck supple.     Right lower leg: No edema.     Left lower leg: No edema.  Skin:    General:  Skin is warm and dry.  Neurological:     General: No focal deficit present.     Mental Status: She is alert and oriented to person, place, and time.  Psychiatric:        Mood and Affect: Mood normal.        Behavior: Behavior normal.    Data Reviewed:  There are no new results to review at this time.  EKG: Vent. rate 79 BPM PR interval 180 ms QRS duration 87 ms QT/QTcB 405/465 ms P-R-T axes 75 71 87 Sinus rhythm Abnormal R-wave progression, early transition No significant change since last tracing  Assessment and Plan: Principal Problem:   GI bleed Admit to stepdown/inpatient. Keep NPO for now. Continue IV fluids. Continue pantoprazole twice daily. Monitor H&H. Transfuse as needed. Official GI consult pending.  Active Problems:   Hyponatremia Seems to be chronic. Likely contributing to her weakness. No diuretics on her medication profile. Was also 127 mmol/L 2 weeks ago. Check urine sodium and osmolality. Further work-up depending on results. Follow-up sodium level in the morning.    Grade I diastolic dysfunction No signs or symptoms of volume overload. Holding metoprolol to avoid hypotension.    Cigarette smoker Nicotine replacement therapy as needed.    Stage 3b chronic kidney disease (HCC) Creatinine and GFR stable.    Coronary artery disease due to calcified coronary lesion Currently NPO. Aspirin, beta-blocker and DOAC have been held.    COPD (chronic obstructive  pulmonary disease) (HCC) Supplemental oxygen and bronchodilators as needed. Smoking cessation.    Dyslipidemia Hold gemfibrozil and fish oils for now. Resume once tolerating solid diet.    GERD (gastroesophageal reflux disease) On parenteral PPI.    Advance Care Planning:   Code Status: Full Code   Consults: Gastroenterology (Dr. Levora Angel).  Family Communication:  Severity of Illness: The appropriate patient status for this patient is INPATIENT. Inpatient status is judged to be reasonable and necessary in order to provide the required intensity of service to ensure the patient's safety. The patient's presenting symptoms, physical exam findings, and initial radiographic and laboratory data in the context of their chronic comorbidities is felt to place them at high risk for further clinical deterioration. Furthermore, it is not anticipated that the patient will be medically stable for discharge from the hospital within 2 midnights of admission.   * I certify that at the point of admission it is my clinical judgment that the patient will require inpatient hospital care spanning beyond 2 midnights from the point of admission due to high intensity of service, high risk for further deterioration and high frequency of surveillance required.*  Author: Bobette Mo, MD 11/27/2021 6:39 PM  For on call review [email or web address].   This document was prepared using Dragon voice recognition software and may contain some unintended transcription errors.

## 2021-11-27 NOTE — ED Provider Notes (Signed)
[facility] EMERGENCY DEPARTMENT Provider Note   CSN: [id number] Arrival date & time: 11/27/21  0900     History  Chief Complaint  Patient presents with   Chest Pain    Kristina Olson is a 76 y.o. female.  HPI    76 year old female with a history of hypertension, hyperlipidemia, COPD, warthin's tumor, stage 3/IV CKD, CAD, afib on xarelto, anemia which has significantly worsened since January who presents with concern for shortness of breath, chest pain, fatigue, nausea, vomiting, leg pain.   Atrial fibrillation started on eliquis, legs were hurting and switched to xarelto, now legs urting for more than a month, now will have fet and hands go numb sometimes, gets dyspnea and not sure if it is from the pain, will need to go somewhere to sit down. Lightheaded.  Dyspnea for 1-2 months, got an inhaler, saw lung dr that helps for a little bit.  When hurtin in legs tries to get up and walk then will feel short of breath.  If rest long enough it starts getting better.  Also worse when laying down flat  Leg pain worse moving around Chest pain, back pain, shoulder pain, deep dull pain, aching., that has been one month.  Worse with exertion.  Also every time eating will have abdominal pain, nausea, then will vomit, sometimes will go away with zofran, sometimes sick anyhow.  For a little while had diarrhea.  Now between both.  Black tarry stools for 2-3 weeks.    Home Medications Prior to Admission medications   Medication Sig Start Date End Date Taking? Authorizing Provider  amLODipine (NORVASC) 5 MG tablet Take 5 mg by mouth daily.    [provider]      Allergies    Gabapentin, Gold, Prednisone, Apixaban, Codeine, and Ibuprofen    Review of Systems   Review of Systems  Physical Exam Updated Vital Signs BP 107/74   Pulse 97   Temp 98.3 F (36.8 C) (Oral)   Resp 20   Ht 5\' 2"  (1.575 m)   Wt 52.2 kg   SpO2 98%   BMI 21.03 kg/m  Physical Exam Vitals and  nursing note reviewed.  Constitutional:      General: She is not in acute distress.    Appearance: She is well-developed. She is not diaphoretic.  HENT:     Head: Normocephalic and atraumatic.  Eyes:     Conjunctiva/sclera: Conjunctivae normal.  Cardiovascular:     Rate and Rhythm: Normal rate and regular rhythm.     Heart sounds: Normal heart sounds. No murmur heard.    No friction rub. No gallop.  Pulmonary:     Effort: Pulmonary effort is normal. No respiratory distress.     Breath sounds: Normal breath sounds. No wheezing or rales.  Abdominal:     General: There is no distension.     Palpations: Abdomen is soft.     Tenderness: There is no abdominal tenderness. There is no guarding.  Musculoskeletal:        General: No tenderness.     Cervical back: Normal range of motion.  Skin:    General: Skin is warm and dry.     Findings: No erythema or rash.  Neurological:     Mental Status: She is alert and oriented to person, place, and time.     ED Results / Procedures / Treatments   Labs (all labs ordered are listed, but only abnormal results are displayed) Labs Reviewed  CBC  WITH DIFFERENTIAL/PLATELET - Abnormal; Notable for the following components:      Result Value   RBC 1.87 (*)    Hemoglobin 6.2 (*)    HCT 18.8 (*)    MCV 100.5 (*)    RDW 17.0 (*)    Abs Immature Granulocytes 0.09 (*)    All other components within normal limits  COMPREHENSIVE METABOLIC PANEL - Abnormal; Notable for the following components:   Sodium 127 (*)    BUN 37 (*)    Creatinine, Ser 1.96 (*)    Calcium 8.4 (*)    Total Protein 6.3 (*)    Albumin 3.2 (*)    GFR, Estimated 26 (*)    All other components within normal limits  LIPASE, BLOOD - Abnormal; Notable for the following components:   Lipase 53 (*)    All other components within normal limits  BRAIN NATRIURETIC PEPTIDE - Abnormal; Notable for the following components:   B Natriuretic Peptide 289.9 (*)    All other components  within normal limits  SARS CORONAVIRUS 2 BY RT PCR  PROTIME-INR  OCCULT BLOOD X 1 CARD TO LAB, STOOL  TROPONIN I (HIGH SENSITIVITY)    EKG EKG Interpretation  Date/Time:  Saturday November 27 2021 09:13:20 EDT Ventricular Rate:  79 PR Interval:  180 QRS Duration: 87 QT Interval:  405 QTC Calculation: 465 R Axis:   71 Text Interpretation: Sinus rhythm Abnormal R-wave progression, early transition No significant change since last tracing Confirmed by Alvira Monday ([id number]) on 11/27/2021 9:17:10 AM  Radiology DG Chest Portable 1 View  Result Date: 11/27/2021 CLINICAL DATA:  Shortness of breath and chest pain with radiation to the shoulders. Weakness. Nausea and vomiting. EXAM: PORTABLE CHEST 1 VIEW COMPARISON:  11/25/2021 FINDINGS: Lungs are adequately inflated and otherwise clear. Cardiomediastinal silhouette and remainder of the exam is unchanged. IMPRESSION: No active disease. Electronically Signed   By: Elberta Fortis M.D.   On: 11/27/2021 10:16    Procedures .Critical Care  Performed by: Alvira Monday, MD Authorized by: Alvira Monday, MD   Critical care provider statement:    Critical care time (minutes):  30   Critical care was time spent personally by me on the following activities:  Development of treatment plan with patient or surrogate, discussions with consultants, examination of patient, ordering and review of laboratory studies, ordering and review of radiographic studies, ordering and performing treatments and interventions, pulse oximetry, re-evaluation of patient's condition and review of old charts     Medications Ordered in ED Medications  pantoprozole (PROTONIX) 80 mg /NS 100 mL infusion (8 mg/hr Intravenous New Bag/Given 11/27/21 1014)  pantoprazole (PROTONIX) injection 40 mg (0 mg Intravenous Not Given 11/27/21 1013)  ondansetron (ZOFRAN) injection 4 mg (4 mg Intravenous Given 11/27/21 1013)  pantoprazole (PROTONIX) 80 mg /NS 100 mL IVPB (0 mg Intravenous  Stopped 11/27/21 1030)    ED Course/ Medical Decision Making/ A&P                           Medical Decision Making Amount and/or Complexity of Data Reviewed External Data Reviewed: labs, radiology and notes. Labs: ordered. Radiology: ordered and independent interpretation performed. Decision-making details documented in ED Course. ECG/medicine tests: ordered and independent interpretation performed. Decision-making details documented in ED Course.  Risk Prescription drug management.   76 year old female with a history of hypertension, hyperlipidemia, COPD, warthin's tumor, stage 3/IV CKD, CAD, afib on xarelto, anemia which has significantly worsened since  January who presents with concern for shortness of breath, chest pain, fatigue, nausea, vomiting, leg pain.   Regarding dyspnea:  Differential diagnosis for dyspnea includes ACS, PE, COPD exacerbation, CHF exacerbation, anemia, pneumonia, viral etiology such as COVID 19 infection, metabolic abnormality.  Chest x-ray was done which showed no pneumonia, pneumothorax, pulmonary edema. EKG was evaluated by me which showed no acute findings.  BNP was 289.  Doubt PE on anticoagulation.  Troponin negative, doubt ACS.  Leg pain-normal pulses, no sign of acute arterial thrombus, DVT, celluilitis or septic arthritis. Abdominal exam benign and doubt acute surgical abdominal emergency. Pain after eating may relate to gastritis/PUD, or consider less likely chronic mesenteric ischemia with anemia contributing.    Hemoglobin 6.2 (12.5 1/31, decreased to 8.9 May 31, 7.2 6/15).  Has seen GI as an outpatient at [facility] but has not had further evaluation. Describes melena for last few weeks, had very small stool sample that did look black in color but was suprisingly hemoccult negative.  Did place on protonix gtt given pain with eating, melena suspicious for upper GI bleed/PUD/gastritis.  Labs also significant for hyponatremia-noted to be the same 5/31, has  had decrease in Na over last several months as well.    Has multiple symptoms that I suspect are likely related to her worsening symptomatic anemia in particular her dyspnea and chest pain on exertion and fatigue.     We do not have blood here to give blood transfusion and there are no telemetry beds available so will send to ED at [facility] for transfusion and admission to hospitalist service.  Notified [facility] Dr. Alessandra Bevels who will consult when she is at the hospital.  Will hold xarelto.        Final Clinical Impression(s) / ED Diagnoses Final diagnoses:  Symptomatic anemia  Melena  Hyponatremia  Shortness of breath  Chest pain, unspecified type    Rx / DC Orders ED Discharge Orders     None         Gareth Luff, MD 11/27/21 1227

## 2021-11-27 NOTE — ED Triage Notes (Signed)
Pt arrives pov, to triage in wheelchair, c/o CP, shob, radiating to shoulders, and "feeling weak". Also reports n/v. Reports tarry stool

## 2021-11-28 DIAGNOSIS — J449 Chronic obstructive pulmonary disease, unspecified: Secondary | ICD-10-CM | POA: Diagnosis not present

## 2021-11-28 DIAGNOSIS — E785 Hyperlipidemia, unspecified: Secondary | ICD-10-CM

## 2021-11-28 DIAGNOSIS — F17210 Nicotine dependence, cigarettes, uncomplicated: Secondary | ICD-10-CM | POA: Diagnosis not present

## 2021-11-28 DIAGNOSIS — K2100 Gastro-esophageal reflux disease with esophagitis, without bleeding: Secondary | ICD-10-CM

## 2021-11-28 LAB — CBC
HCT: 25.2 % — ABNORMAL LOW (ref 36.0–46.0)
Hemoglobin: 8.3 g/dL — ABNORMAL LOW (ref 12.0–15.0)
MCH: 32 pg (ref 26.0–34.0)
MCHC: 32.9 g/dL (ref 30.0–36.0)
MCV: 97.3 fL (ref 80.0–100.0)
Platelets: 320 10*3/uL (ref 150–400)
RBC: 2.59 MIL/uL — ABNORMAL LOW (ref 3.87–5.11)
RDW: 17.9 % — ABNORMAL HIGH (ref 11.5–15.5)
WBC: 8.1 10*3/uL (ref 4.0–10.5)
nRBC: 0 % (ref 0.0–0.2)

## 2021-11-28 LAB — RENAL FUNCTION PANEL
Albumin: 3.4 g/dL — ABNORMAL LOW (ref 3.5–5.0)
Anion gap: 7 (ref 5–15)
BUN: 34 mg/dL — ABNORMAL HIGH (ref 8–23)
CO2: 22 mmol/L (ref 22–32)
Calcium: 8.7 mg/dL — ABNORMAL LOW (ref 8.9–10.3)
Chloride: 103 mmol/L (ref 98–111)
Creatinine, Ser: 1.97 mg/dL — ABNORMAL HIGH (ref 0.44–1.00)
GFR, Estimated: 26 mL/min — ABNORMAL LOW (ref >60)
Glucose, Bld: 86 mg/dL (ref 70–99)
Phosphorus: 3.1 mg/dL (ref 2.5–4.6)
Potassium: 4.3 mmol/L (ref 3.5–5.1)
Sodium: 132 mmol/L — ABNORMAL LOW (ref 135–145)

## 2021-11-28 LAB — OSMOLALITY, URINE: Osmolality, Ur: 226 mOsm/kg — ABNORMAL LOW (ref 300–900)

## 2021-11-28 NOTE — Plan of Care (Signed)
  Problem: Education: Goal: Knowledge of General Education information will improve Description: Including pain rating scale, medication(s)/side effects and non-pharmacologic comfort measures Outcome: Completed/Met   Problem: Elimination: Goal: Will not experience complications related to urinary retention Outcome: Completed/Met   Problem: Skin Integrity: Goal: Risk for impaired skin integrity will decrease Outcome: Completed/Met   Problem: Health Behavior/Discharge Planning: Goal: Ability to manage health-related needs will improve Outcome: Progressing   Problem: Clinical Measurements: Goal: Ability to maintain clinical measurements within normal limits will improve Outcome: Progressing Goal: Will remain free from infection Outcome: Progressing Goal: Diagnostic test results will improve Outcome: Progressing Goal: Respiratory complications will improve Outcome: Progressing Goal: Cardiovascular complication will be avoided Outcome: Progressing   Problem: Activity: Goal: Risk for activity intolerance will decrease Outcome: Progressing   Problem: Coping: Goal: Level of anxiety will decrease Outcome: Progressing   Problem: Elimination: Goal: Will not experience complications related to bowel motility Outcome: Progressing   Problem: Pain Managment: Goal: General experience of comfort will improve Outcome: Progressing   Problem: Safety: Goal: Ability to remain free from injury will improve Outcome: Progressing

## 2021-11-28 NOTE — Progress Notes (Deleted)
Nursing instructor, Angela Moore, reported to RN that during patient care, pt reported to both she and the student that she spilled coffee on her groin earlier in the admission causing a burning to her thighs, buttocks and groin. MD aware. 

## 2021-11-28 NOTE — Hospital Course (Signed)
The patient is a 76 year old elderly Caucasian female with a past medical history significant for but not limited to proximal atrial fibrillation, COPD, tobacco abuse and cigarette smoker, anxiety and depression, stage IIIb chronic kidney disease, colonic polyps as well as other comorbidities who presented to the ED due to dyspnea, chest pressure radiating into her shoulders and progressively worsening generalized weakness with a history of nausea vomiting and lower abdominal pain with recent constipation followed by dark tarry stools several times after she was started on Eliquis for PAF a few weeks ago.  She states that she was on Eliquis previously and then switched to Xarelto but continues to have some dark tarry stools and felt very fatigued and tired.  Denies any other complaints and was brought in and had a hemoglobin of 6.2 with her hemoglobin being 8.2 a few weeks ago.  CMP showed that her sodium was 127 and her BUNs/creatinine was 37/1.96.  GI was consulted and they are planning for an EGD in the a.m.  She is supposed to have an outpatient colonoscopy on December 15, 2021 with digestive health.  GI recommending soft diet today and continue Protonix for now.  Plan is to evaluate her anemia with melena and rule out GI bleed given that her hemoglobin had dropped.

## 2021-11-28 NOTE — Progress Notes (Signed)
PROGRESS NOTE    Kristina Olson  UEA:[id number] DOB: 28-Oct-1945 DOA: 11/27/2021 PCP: Pcp, No   Brief Narrative:  The patient is a 76 year old elderly Caucasian female with a past medical history significant for but not limited to proximal atrial fibrillation, COPD, tobacco abuse and cigarette smoker, anxiety and depression, stage IIIb chronic kidney disease, colonic polyps as well as other comorbidities who presented to the ED due to dyspnea, chest pressure radiating into her shoulders and progressively worsening generalized weakness with a history of nausea vomiting and lower abdominal pain with recent constipation followed by dark tarry stools several times after she was started on Eliquis for PAF a few weeks ago.  She states that she was on Eliquis previously and then switched to Xarelto but continues to have some dark tarry stools and felt very fatigued and tired.  Denies any other complaints and was brought in and had a hemoglobin of 6.2 with her hemoglobin being 8.2 a few weeks ago.  CMP showed that her sodium was 127 and her BUNs/creatinine was 37/1.96.  GI was consulted and they are planning for an EGD in the a.m.  She is supposed to have an outpatient colonoscopy on December 15, 2021 with digestive health.  GI recommending soft diet today and continue Protonix for now.  Plan is to evaluate her anemia with melena and rule out GI bleed given that her hemoglobin had dropped.  Assessment and Plan:  GI bleed with Melena Macrocytic Anemia -Admitted to stepdown/inpatient. -Kept NPO but now EGD being done on 11/29/21 so GI started her on a Soft Diet  -Hgb/Hct went from 6.2/18.8 -> 5.7/18.0 -> 8.3 after Transfusion of 1 unit of pRBC -IVF now stopped -Continue Pantoprazole gtt and then IV Pantoprazole 40 mg q12h -Monitor H&H. -Transfuse as needed. -FOBT was Negative  -Continue to Monitor for S/Sx of Bleeding -Will get EGD in the AM    Hyponatremia -Seems to be chronic. -Likely contributing to her  weakness. -No diuretics on her medication profile. -Was also 127 and improved to 132 -Checked urine sodium and was 53 and osmolality was 226. -Continue to Monitor and Trend    Grade I diastolic dysfunction -No signs or symptoms of volume overload. -BNP was a little elevated at 289.9 -Holding metoprolol to avoid hypotension. -Continue to Monitor for S/Sx of Volume Overload   Tobacco Abuse and Cigarette smoker -Nicotine replacement therapy as needed. -Smoking Cessastion Counseling given    Stage 3b chronic kidney disease (HCC) -Patient's BUNs/creatinine went from 37/1.96 and is now 30/1.97 -Avoid further nephrotoxic medications, contrast dyes, hypotension and dehydration to ensure adequate renal perfusion and renally adjust medications -Repeat CMP in a.m.   Coronary artery disease due to calcified coronary lesion -Was n.p.o. -Currently aspirin, beta-blocker and DOAC have been held given her concern for GI bleeding .   COPD (chronic obstructive pulmonary disease) (HCC) -Supplemental oxygen and bronchodilators as needed. -Smoking cessation.   Dyslipidemia -Hold gemfibrozil and fish oils for now. -Resume after EGD   GERD (gastroesophageal reflux disease)/GI Prophylaxis -On PPI drip   DVT prophylaxis: SCDs Start: 11/27/21 1701    Code Status: Full Code Family Communication: No family currently at bedside  Disposition Plan:  Level of care: Progressive Status is: Inpatient Remains inpatient appropriate because: Needs an EGD in the a.m.  Consultants:  Gastroenterology  Procedures:  None  Antimicrobials:  Anti-infectives (From admission, onward)    None       Subjective: Seen and examined at bedside and states that she is  hungry.  Denied chest pain or shortness of breath..  States that she is having quite a bit of melena and does not want to do this again.  No other concerns at this time.  Objective: Vitals:   11/27/21 2202 11/28/21 0059 11/28/21 0608 11/28/21  1012  BP: 135/68 (!) 123/51 118/71 130/62  Pulse: 84 80 78 83  Resp: 18 16 18 18   Temp: 98.6 F (37 C) 98.6 F (37 C) 98.3 F (36.8 C) 98.1 F (36.7 C)  TempSrc: Oral Oral Oral Oral  SpO2:  95% 95% 94%  Weight:      Height:        Intake/Output Summary (Last 24 hours) at 11/28/2021 1405 Last data filed at 11/28/2021 1400 Gross per 24 hour  Intake 1127.86 ml  Output 401 ml  Net 726.86 ml   Filed Weights   11/27/21 0909  Weight: 52.2 kg   Examination: Physical Exam:  Constitutional: Thin elderly Caucasian female currently no acute distress Respiratory: Diminished to auscultation bilaterally, no wheezing, rales, rhonchi or crackles. Normal respiratory effort and patient is not tachypenic. No accessory muscle use.  Unlabored breathing Cardiovascular: RRR, no murmurs / rubs / gallops. S1 and S2 auscultated. No extremity edema.  Abdomen: Soft, non-tender, non-distended. Bowel sounds positive.  GU: Deferred. Musculoskeletal: No clubbing / cyanosis of digits/nails. No joint deformity upper and lower extremities.  Neurologic: CN 2-12 grossly intact with no focal deficits. Romberg sign and cerebellar reflexes not assessed.  Psychiatric: Normal judgment and insight. Alert and oriented x 3.  A little anxious mood  Data Reviewed: I have personally reviewed following labs and imaging studies  CBC: Recent Labs  Lab 11/27/21 0914 11/27/21 1822 11/28/21 0503  WBC 9.6  --  8.1  NEUTROABS 5.4  --   --   HGB 6.2* 5.7* 8.3*  HCT 18.8* 18.0* 25.2*  MCV 100.5*  --  97.3  PLT 373  --  320   Basic Metabolic Panel: Recent Labs  Lab 11/27/21 0914 11/28/21 0503  NA 127* 132*  K 4.6 4.3  CL 99 103  CO2 23 22  GLUCOSE 90 86  BUN 37* 34*  CREATININE 1.96* 1.97*  CALCIUM 8.4* 8.7*  PHOS  --  3.1   GFR: Estimated Creatinine Clearance: 19.2 mL/min (A) (by C-G formula based on SCr of 1.97 mg/dL (H)). Liver Function Tests: Recent Labs  Lab 11/27/21 0914 11/28/21 0503  AST 16  --    ALT 12  --   ALKPHOS 63  --   BILITOT 0.3  --   PROT 6.3*  --   ALBUMIN 3.2* 3.4*   Recent Labs  Lab 11/27/21 0914  LIPASE 53*   No results for input(s): "AMMONIA" in the last 168 hours. Coagulation Profile: Recent Labs  Lab 11/27/21 0914  INR 1.2   Cardiac Enzymes: No results for input(s): "CKTOTAL", "CKMB", "CKMBINDEX", "TROPONINI" in the last 168 hours. BNP (last 3 results) No results for input(s): "PROBNP" in the last 8760 hours. HbA1C: No results for input(s): "HGBA1C" in the last 72 hours. CBG: No results for input(s): "GLUCAP" in the last 168 hours. Lipid Profile: No results for input(s): "CHOL", "HDL", "LDLCALC", "TRIG", "CHOLHDL", "LDLDIRECT" in the last 72 hours. Thyroid Function Tests: No results for input(s): "TSH", "T4TOTAL", "FREET4", "T3FREE", "THYROIDAB" in the last 72 hours. Anemia Panel: No results for input(s): "VITAMINB12", "FOLATE", "FERRITIN", "TIBC", "IRON", "RETICCTPCT" in the last 72 hours. Sepsis Labs: No results for input(s): "PROCALCITON", "LATICACIDVEN" in the last 168 hours.  Recent Results (from the past 240 hour(s))  SARS Coronavirus 2 by RT PCR (hospital order, performed in [facility] hospital lab) *cepheid single result test* Anterior Nasal Swab     Status: None   Collection Time: 11/27/21 10:11 AM   Specimen: Anterior Nasal Swab  Result Value Ref Range Status   SARS Coronavirus 2 by RT PCR NEGATIVE NEGATIVE Final    Comment: (NOTE) SARS-CoV-2 target nucleic acids are NOT DETECTED.  The SARS-CoV-2 RNA is generally detectable in upper and lower respiratory specimens during the acute phase of infection. The lowest concentration of SARS-CoV-2 viral copies this assay can detect is 250 copies / mL. A negative result does not preclude SARS-CoV-2 infection and should not be used as the sole basis for treatment or other patient management decisions.  A negative result may occur with improper specimen collection / handling, submission of  specimen other than nasopharyngeal swab, presence of viral mutation(s) within the areas targeted by this assay, and inadequate number of viral copies (<250 copies / mL). A negative result must be combined with clinical observations, patient history, and epidemiological information.  Fact Sheet for Patients:   https://www.patel.info/  Fact Sheet for Healthcare Providers: https://hall.com/  This test is not yet approved or  cleared by the [place] FDA and has been authorized for detection and/or diagnosis of SARS-CoV-2 by FDA under an Emergency Use Authorization (EUA).  This EUA will remain in effect (meaning this test can be used) for the duration of the COVID-19 declaration under Section 564(b)(1) of the Act, 21 U.S.C. section 360bbb-3(b)(1), unless the authorization is terminated or revoked sooner.  Performed at [facility], [address], [place], [place] [address]      Radiology Studies: DG Chest Portable 1 View  Result Date: 11/27/2021 CLINICAL DATA:  Shortness of breath and chest pain with radiation to the shoulders. Weakness. Nausea and vomiting. EXAM: PORTABLE CHEST 1 VIEW COMPARISON:  11/25/2021 FINDINGS: Lungs are adequately inflated and otherwise clear. Cardiomediastinal silhouette and remainder of the exam is unchanged. IMPRESSION: No active disease. Electronically Signed   By: Marin Olp M.D.   On: 11/27/2021 10:16     Scheduled Meds:  sodium chloride   Intravenous Once   LORazepam  0.5-1 mg Oral QHS   [START ON 11/30/2021] pantoprazole  40 mg Intravenous Q12H   Continuous Infusions:  sodium chloride     pantoprazole 8 mg/hr (11/28/21 0643)    LOS: 1 day   Raiford Noble, DO [facility] Available via Epic secure chat 7am-7pm After these hours, please refer to coverage provider listed on [email or web address] 11/28/2021, 2:05 PM

## 2021-11-29 ENCOUNTER — Encounter (HOSPITAL_COMMUNITY): Payer: Self-pay

## 2021-11-29 ENCOUNTER — Encounter (HOSPITAL_COMMUNITY): Payer: Self-pay | Admitting: Certified Registered Nurse Anesthetist

## 2021-11-29 ENCOUNTER — Inpatient Hospital Stay (HOSPITAL_COMMUNITY): Payer: Medicare HMO | Admitting: Anesthesiology

## 2021-11-29 ENCOUNTER — Encounter (HOSPITAL_COMMUNITY)
Admission: EM | Disposition: A | Discharge status: Home / Self Care | Payer: Self-pay | Source: Home / Self Care | Attending: Internal Medicine

## 2021-11-29 DIAGNOSIS — K921 Melena: Secondary | ICD-10-CM

## 2021-11-29 DIAGNOSIS — K922 Gastrointestinal hemorrhage, unspecified: Secondary | ICD-10-CM | POA: Diagnosis not present

## 2021-11-29 DIAGNOSIS — I2510 Atherosclerotic heart disease of native coronary artery without angina pectoris: Secondary | ICD-10-CM | POA: Diagnosis not present

## 2021-11-29 DIAGNOSIS — F17210 Nicotine dependence, cigarettes, uncomplicated: Secondary | ICD-10-CM | POA: Diagnosis not present

## 2021-11-29 DIAGNOSIS — D62 Acute posthemorrhagic anemia: Secondary | ICD-10-CM

## 2021-11-29 DIAGNOSIS — J449 Chronic obstructive pulmonary disease, unspecified: Secondary | ICD-10-CM | POA: Diagnosis not present

## 2021-11-29 DIAGNOSIS — I2584 Coronary atherosclerosis due to calcified coronary lesion: Secondary | ICD-10-CM

## 2021-11-29 DIAGNOSIS — E785 Hyperlipidemia, unspecified: Secondary | ICD-10-CM

## 2021-11-29 DIAGNOSIS — I10 Essential (primary) hypertension: Secondary | ICD-10-CM

## 2021-11-29 DIAGNOSIS — K2100 Gastro-esophageal reflux disease with esophagitis, without bleeding: Secondary | ICD-10-CM

## 2021-11-29 DIAGNOSIS — K2971 Gastritis, unspecified, with bleeding: Secondary | ICD-10-CM

## 2021-11-29 HISTORY — PX: ESOPHAGOGASTRODUODENOSCOPY (EGD) WITH PROPOFOL: SHX5813

## 2021-11-29 LAB — COMPREHENSIVE METABOLIC PANEL
ALT: 13 U/L (ref 0–44)
AST: 17 U/L (ref 15–41)
Albumin: 3.3 g/dL — ABNORMAL LOW (ref 3.5–5.0)
Alkaline Phosphatase: 59 U/L (ref 38–126)
Anion gap: 9 (ref 5–15)
BUN: 35 mg/dL — ABNORMAL HIGH (ref 8–23)
CO2: 21 mmol/L — ABNORMAL LOW (ref 22–32)
Calcium: 8.9 mg/dL (ref 8.9–10.3)
Chloride: 106 mmol/L (ref 98–111)
Creatinine, Ser: 2 mg/dL — ABNORMAL HIGH (ref 0.44–1.00)
GFR, Estimated: 25 mL/min — ABNORMAL LOW (ref >60)
Glucose, Bld: 90 mg/dL (ref 70–99)
Potassium: 4.1 mmol/L (ref 3.5–5.1)
Sodium: 136 mmol/L (ref 135–145)
Total Bilirubin: 0.8 mg/dL (ref 0.3–1.2)
Total Protein: 6.1 g/dL — ABNORMAL LOW (ref 6.5–8.1)

## 2021-11-29 LAB — CBC WITH DIFFERENTIAL/PLATELET
Abs Immature Granulocytes: 0.07 10*3/uL (ref 0.00–0.07)
Basophils Absolute: 0.1 10*3/uL (ref 0.0–0.1)
Basophils Relative: 1 %
Eosinophils Absolute: 0.3 10*3/uL (ref 0.0–0.5)
Eosinophils Relative: 3 %
HCT: 26.2 % — ABNORMAL LOW (ref 36.0–46.0)
Hemoglobin: 8.8 g/dL — ABNORMAL LOW (ref 12.0–15.0)
Immature Granulocytes: 1 %
Lymphocytes Relative: 33 %
Lymphs Abs: 3.1 10*3/uL (ref 0.7–4.0)
MCH: 32.7 pg (ref 26.0–34.0)
MCHC: 33.6 g/dL (ref 30.0–36.0)
MCV: 97.4 fL (ref 80.0–100.0)
Monocytes Absolute: 0.8 10*3/uL (ref 0.1–1.0)
Monocytes Relative: 9 %
Neutro Abs: 5.2 10*3/uL (ref 1.7–7.7)
Neutrophils Relative %: 53 %
Platelets: 373 10*3/uL (ref 150–400)
RBC: 2.69 MIL/uL — ABNORMAL LOW (ref 3.87–5.11)
RDW: 18.3 % — ABNORMAL HIGH (ref 11.5–15.5)
WBC: 9.6 10*3/uL (ref 4.0–10.5)
nRBC: 0 % (ref 0.0–0.2)

## 2021-11-29 LAB — TYPE AND SCREEN
ABO/RH(D): B POS
Antibody Screen: NEGATIVE
Unit division: 0

## 2021-11-29 LAB — BPAM RBC
Blood Product Expiration Date: 202307042359
ISSUE DATE / TIME: 202306172143
Unit Type and Rh: 7300

## 2021-11-29 LAB — PHOSPHORUS: Phosphorus: 3.7 mg/dL (ref 2.5–4.6)

## 2021-11-29 LAB — RAPID URINE DRUG SCREEN, HOSP PERFORMED
Amphetamines: NOT DETECTED
Barbiturates: NOT DETECTED
Benzodiazepines: NOT DETECTED
Cocaine: NOT DETECTED
Opiates: NOT DETECTED
Tetrahydrocannabinol: NOT DETECTED

## 2021-11-29 LAB — MAGNESIUM: Magnesium: 2.3 mg/dL (ref 1.7–2.4)

## 2021-11-29 SURGERY — ESOPHAGOGASTRODUODENOSCOPY (EGD) WITH PROPOFOL
Anesthesia: Monitor Anesthesia Care

## 2021-11-29 MED ORDER — PEG-KCL-NACL-NASULF-NA ASC-C 100 G PO SOLR: 0.5000 | Freq: Once | ORAL | Status: DC

## 2021-11-29 MED ORDER — METOPROLOL TARTRATE 5 MG/5ML IV SOLN
5.0000 mg | Freq: Once | INTRAVENOUS | Status: AC
  Administered 2021-11-29: 5 mg via INTRAVENOUS
  Filled 2021-11-29: qty 5

## 2021-11-29 MED ORDER — LIDOCAINE 2% (20 MG/ML) 5 ML SYRINGE
INTRAMUSCULAR | Status: DC | PRN
  Administered 2021-11-29: 60 mg via INTRAVENOUS

## 2021-11-29 MED ORDER — PEG-KCL-NACL-NASULF-NA ASC-C 100 G PO SOLR
0.5000 | Freq: Once | ORAL | Status: DC
  Filled 2021-11-29: qty 1

## 2021-11-29 MED ORDER — PROPOFOL 10 MG/ML IV BOLUS
INTRAVENOUS | Status: AC
  Filled 2021-11-29: qty 20

## 2021-11-29 MED ORDER — PROPOFOL 10 MG/ML IV BOLUS
INTRAVENOUS | Status: DC | PRN
  Administered 2021-11-29: 20 mg via INTRAVENOUS

## 2021-11-29 MED ORDER — PEG-KCL-NACL-NASULF-NA ASC-C 100 G PO SOLR
1.0000 | Freq: Once | ORAL | Status: DC
Start: 2021-11-29 — End: 2021-11-29

## 2021-11-29 MED ORDER — PROPOFOL 500 MG/50ML IV EMUL
INTRAVENOUS | Status: DC | PRN
  Administered 2021-11-29: 125 ug/kg/min via INTRAVENOUS

## 2021-11-29 MED ORDER — SODIUM CHLORIDE 0.9 % IV SOLN: INTRAVENOUS | Status: DC

## 2021-11-29 MED ORDER — PANTOPRAZOLE SODIUM 40 MG PO TBEC: 40.0000 mg | DELAYED_RELEASE_TABLET | Freq: Every day | ORAL | 0 refills | Status: AC

## 2021-11-29 SURGICAL SUPPLY — 15 items

## 2021-11-29 NOTE — Anesthesia Preprocedure Evaluation (Signed)
Anesthesia Evaluation  Patient identified by MRN, date of birth, ID band Patient awake    Reviewed: Allergy & Precautions, NPO status , Patient's Chart, lab work & pertinent test results  Airway Mallampati: II  TM Distance: >3 FB Neck ROM: Full    Dental  (+) Upper Dentures, Lower Dentures   Pulmonary COPD, Current Smoker and Patient abstained from smoking.,    Pulmonary exam normal        Cardiovascular hypertension, Pt. on medications and Pt. on home beta blockers + CAD   Rhythm:Regular Rate:Normal     Neuro/Psych Anxiety TIA   GI/Hepatic Neg liver ROS, GERD  Medicated,  Endo/Other  negative endocrine ROS  Renal/GU   negative genitourinary   Musculoskeletal  (+) Arthritis , Osteoarthritis,    Abdominal Normal abdominal exam  (+)   Peds  Hematology  (+) Blood dyscrasia, anemia ,   Anesthesia Other Findings   Reproductive/Obstetrics                             Anesthesia Physical Anesthesia Plan  ASA: 3  Anesthesia Plan: MAC   Post-op Pain Management:    Induction: Intravenous  PONV Risk Score and Plan: 1 and Propofol infusion and Treatment may vary due to age or medical condition  Airway Management Planned: Simple Face Mask, Natural Airway and Nasal Cannula  Additional Equipment: None  Intra-op Plan:   Post-operative Plan:   Informed Consent: I have reviewed the patients History and Physical, chart, labs and discussed the procedure including the risks, benefits and alternatives for the proposed anesthesia with the patient or authorized representative who has indicated his/her understanding and acceptance.     Dental advisory given  Plan Discussed with: CRNA  Anesthesia Plan Comments:         Anesthesia Quick Evaluation

## 2021-11-29 NOTE — Interval H&P Note (Signed)
History and Physical Interval Note:  11/29/2021 1:19 PM  Kristina Olson  has presented today for surgery, with the diagnosis of Anemia and melena.  The various methods of treatment have been discussed with the patient and family. After consideration of risks, benefits and other options for treatment, the patient has consented to  Procedure(s): ESOPHAGOGASTRODUODENOSCOPY (EGD) WITH PROPOFOL (N/A) as a surgical intervention.  The patient's history has been reviewed, patient examined, no change in status, stable for surgery.  I have reviewed the patient's chart and labs.  Questions were answered to the patient's satisfaction.     [name]

## 2021-11-29 NOTE — Op Note (Signed)
[facility] Patient Name: Kristina Olson Procedure Date: 11/29/2021 MRN: [id number] Attending MD: Willis Modena , MD Date of Birth: 09/22/1945 CSN: [id number] Age: 76 Admit Type: Inpatient Procedure:                Upper GI endoscopy Indications:              Acute post hemorrhagic anemia, Melena Providers:                Willis Modena, MD, Blenda Mounts, RN, Joannie Springs, Technician Referring MD:             [facility] Medicines:                Monitored Anesthesia Care Complications:            No immediate complications. Estimated Blood Loss:     Estimated blood loss: none. Procedure:                Pre-Anesthesia Assessment:                           - Prior to the procedure, a History and Physical                            was performed, and patient medications and                            allergies were reviewed. The patient's tolerance of                            previous anesthesia was also reviewed. The risks                            and benefits of the procedure and the sedation                            options and risks were discussed with the patient.                            All questions were answered, and informed consent                            was obtained. Prior Anticoagulants: The patient has                            taken Xarelto (rivaroxaban), last dose was 3 days                            prior to procedure. ASA Grade Assessment: III - A                            patient with severe systemic disease. After  reviewing the risks and benefits, the patient was                            deemed in satisfactory condition to undergo the                            procedure.                           After obtaining informed consent, the endoscope was                            passed under direct vision. Throughout the                            procedure, the patient's  blood pressure, pulse, and                            oxygen saturations were monitored continuously. The                            [id number] Olympus endoscope was introduced                            through the mouth, and advanced to the second part                            of duodenum. The upper GI endoscopy was                            accomplished without difficulty. The patient                            tolerated the procedure well. Scope In: Scope Out: Findings:      The examined esophagus was normal.      Patchy minimal inflammation was found in the entire examined stomach.      The exam of the stomach was otherwise normal.      The duodenal bulb, first portion of the duodenum and second portion of       the duodenum were normal.      No old or fresh blood was seen to the extent of our examination. Impression:               - Normal esophagus.                           - Gastritis.                           - Normal duodenal bulb, first portion of the                            duodenum and second portion of the duodenum.                           - No  specimens collected. Moderate Sedation:      Not Applicable - Patient had care per Anesthesia. Recommendation:           - Return patient to hospital ward for ongoing care.                           - Clear liquid diet today.                           - Continue present medications.                           - Will discuss colonoscopy with patient, hopefully                            to be done tomorrow.                           - Continue to hold anticoagulants.                           [facility] will follow. Procedure Code(s):        --- Professional ---                           [phone], Esophagogastroduodenoscopy, flexible,                            transoral; diagnostic, including collection of                            specimen(s) by brushing or washing, when performed                             (separate procedure) Diagnosis Code(s):        --- Professional ---                           K29.70, Gastritis, unspecified, without bleeding                           D62, Acute posthemorrhagic anemia                           K92.1, Melena (includes Hematochezia) CPT copyright 2019 American [employer] Association. All rights reserved. The codes documented in this report are preliminary and upon coder review may  be revised to meet current compliance requirements. Willis Modena, MD 11/29/2021 1:42:25 PM This report has been signed electronically. Number of Addenda: 0

## 2021-11-29 NOTE — Discharge Summary (Signed)
Physician Discharge Summary   Patient: Kristina Olson MRN: [id number] DOB: 01/11/1946  Admit date:     11/27/2021  Discharge date: 11/29/21  Discharge Physician: Marguerita Merles, DO   PCP: Pcp, No   Recommendations at discharge:   Follow-up with PCP within 1 to 2 weeks and repeat CBC, CMP, mag, Phos within 1 week Follow-up with gastroenterology in outpatient setting within 1 to 2 weeks and have outpatient colonoscopy done given that she is refused it here; will need to hold aspirin and anticoagulants until cleared by GI in outpatient setting and continue PPI daily  Discharge Diagnoses: Principal Problem:   GI bleed Active Problems:   Cigarette smoker   Stage 3b chronic kidney disease (HCC)   Coronary artery disease due to calcified coronary lesion   COPD (chronic obstructive pulmonary disease) (HCC)   Dyslipidemia   GERD (gastroesophageal reflux disease)   Grade I diastolic dysfunction   Hyponatremia  Resolved Problems:   * No resolved hospital problems. [facility] Course: The patient is a 76 year old elderly Caucasian female with a past medical history significant for but not limited to proximal atrial fibrillation, COPD, tobacco abuse and cigarette smoker, anxiety and depression, stage IIIb chronic kidney disease, colonic polyps as well as other comorbidities who presented to the ED due to dyspnea, chest pressure radiating into her shoulders and progressively worsening generalized weakness with a history of nausea vomiting and lower abdominal pain with recent constipation followed by dark tarry stools several times after she was started on Eliquis for PAF a few weeks ago.  She states that she was on Eliquis previously and then switched to Xarelto but continues to have some dark tarry stools and felt very fatigued and tired.  Denies any other complaints and was brought in and had a hemoglobin of 6.2 with her hemoglobin being 8.2 a few weeks ago.  CMP showed that her sodium was 127 and her  BUNs/creatinine was 37/1.96.  GI was consulted and they are planning for an EGD in the a.m.  She is supposed to have an outpatient colonoscopy on December 15, 2021 with digestive health.  GI recommending soft diet today and continue Protonix for now.  Plan is to evaluate her anemia with melena and rule out GI bleed given that her hemoglobin had dropped and will be undergoing EGD.  EGD done today and showed a normal esophagus and gastritis as well as normal duodenal bulb and first portion of the duodenum and second portion of the duodenum.  No specimens were collected.  GI recommending clear liquid diet today and continue present medications and continue to hold anticoagulants and discussing colonoscopy with the patient to be done tomorrow.  Assessment and Plan: No notes have been filed under this hospital service. Service: Hospitalist  Macrocytic Anemia -Admitted to stepdown/inpatient. -Kept NPO but now EGD being done on 11/29/21 so GI started her on a Soft Diet and was NPO this AM  -Hgb/Hct went from 6.2/18.8 -> 5.7/18.0 -> 8.3/25.2 after Transfusion of 1 unit of pRBC and is now 8.8/26.2 -IVF now stopped -Continue Pantoprazole gtt and then IV Pantoprazole 40 mg q12h -Monitor H&H. -Transfuse as needed. -FOBT was Negative  -Continue to Monitor for S/Sx of Bleeding -EGD done this AM and showed a normal esophagus with gastritis and a normal duodenal bulb with the first portion of the duodenum and second portion of the duodenum with no specimens collected.  GI recommending clear liquid diet today and continue present medications and discussing colonoscopy with the  patient to be done tomorrow however patient refused inpatient colonoscopy; see above -GI recommends continue to hold anticoagulation we will follow with the patient however since patient refused inpatient colonoscopy and GI had no further recommendations at this time recommending discharging home and holding anticoagulants and having her follow-up  with her primary gastroenterologist for expedited colonoscopy within 1 week   Hyponatremia -Seems to be chronic. -Likely contributing to her weakness. -No diuretics on her medication profile. -Was also 127 and improved to 132 yesterday and today is 136 -Checked urine sodium and was 53 and osmolality was 226. -Continue to Monitor and Trend in the outpatient setting repeat CMP within 1 week   Grade I diastolic dysfunction -No signs or symptoms of volume overload. -BNP was a little elevated at 289.9 -Holding metoprolol to avoid hypotension. -Continue to Monitor for S/Sx of Volume Overload   Tobacco Abuse and Cigarette smoker -Nicotine replacement therapy as needed. -Smoking Cessastion Counseling given    Stage 3b chronic kidney disease (HCC) Metabolic acidosis -Patient's BUNs/creatinine went from 37/1.96 -> 30/1.97 -> 35/2.00 -The patient has a slight metabolic acidosis with a CO2 of 21, anion gap of 9, chloride level of 106 -Avoid further nephrotoxic medications, contrast dyes, hypotension and dehydration to ensure adequate renal perfusion and renally adjust medications -Repeat CMP within 1 week   Coronary artery disease due to calcified coronary lesion -Was n.p.o but was placed on a diet yesterday and now on a clear liquid diet and will be n.p.o. for the morning for likely colonoscopy -Currently aspirin, beta-blocker and DOAC have been held given her concern for GI bleeding and recommending holding these at discharge until source of melena is found as an outpatient   COPD (chronic obstructive pulmonary disease) (HCC) -Supplemental oxygen and bronchodilators as needed. -Smoking cessation. -SpO2: 98 % O2 Flow Rate (L/min): 6 L/min -Was not wearing any supplemental oxygen via nasal cannula this morning and she did not complain of any shortness of breath walking   Hypoalbuminemia -Mild and patient's albumin level went from 3.4 -> 3.3 -Continue to Monitor and Trend -Repeat CMP in  the AM    Dyslipidemia -Hold gemfibrozil and fish oils for now. -Resume after EGD but will hold today given that she is on a clear liquid diet and likely undergoing colonoscopy tomorrow   GERD (gastroesophageal reflux disease)/GI Prophylaxis -On PPI gtt. and changed to p.o. PPI pantoprazole 40 mg daily per GI recommendations  Consultants: Gastroenterology  Procedures performed: EGD   Disposition: Home Diet recommendation:  Discharge Diet Orders (From admission, onward)     Start     Ordered   11/29/21 0000  Diet - low sodium heart healthy        11/29/21 1556           Cardiac diet DISCHARGE MEDICATION: Allergies as of 11/29/2021       Reactions   Gabapentin Shortness Of Breath, Other (See Comments)   Allergic to high dose over 300 mg dose at one time   Gold Itching   Prednisone Shortness Of Breath, Palpitations, Other (See Comments)   Tachycardia (steroids)   Apixaban Other (See Comments)   EXTREME LEG PAIN   Acetaminophen Diarrhea   Codeine Nausea And Vomiting, Other (See Comments)   Causes extreme headaches, also   Ibuprofen Diarrhea   Latex Rash   Tape Rash, Other (See Comments)   PAPER tape only, please!!        Medication List     STOP taking these medications  aspirin EC 81 MG tablet   omeprazole 40 MG capsule Commonly known as: PRILOSEC   Xarelto 20 MG Tabs tablet Generic drug: rivaroxaban       TAKE these medications    amLODipine 5 MG tablet Commonly known as: NORVASC Take 5 mg by mouth daily.   FISH OIL PO Take 1-2 capsules by mouth daily.   gemfibrozil 600 MG tablet Commonly known as: LOPID Take 600 mg by mouth 2 (two) times daily before a meal.   LORazepam 1 MG tablet Commonly known as: ATIVAN Take 0.5-1 mg by mouth See admin instructions. Take 0.5-1 mg by mouth at bedtime and an additional 1 mg once a day as needed for anxiety   MAGNESIUM PO Take 1 tablet by mouth at bedtime as needed (for leg  discomfort/pain/cramping).   metoprolol succinate 50 MG 24 hr tablet Commonly known as: TOPROL-XL Take 50 mg by mouth 2 (two) times daily. Take with or immediately following a meal.   ondansetron 8 MG disintegrating tablet Commonly known as: ZOFRAN-ODT Take 8 mg by mouth daily as needed for nausea or vomiting (dissolve orally).   pantoprazole 40 MG tablet Commonly known as: Protonix Take 1 tablet (40 mg total) by mouth daily.   Ventolin HFA 108 (90 Base) MCG/ACT inhaler Generic drug: albuterol Inhale 1-2 puffs into the lungs See admin instructions. Inhale 1-2 puffs into the lungs every four to six hours as needed for shortness of breath or wheezing   VITAMIN B-12 PO Take 1 tablet by mouth daily.   VITAMIN D-3 PO Take 1 capsule by mouth daily.        Discharge Exam: Filed Weights   11/27/21 0909  Weight: 52.2 kg   Vitals:   11/29/21 1410 11/29/21 1423  BP: (!) 151/50 (!) 151/81  Pulse: 79 86  Resp: 17 18  Temp:  98.5 F (36.9 C)  SpO2: 95% 98%   EXAM from this AM: Examination: Physical Exam:   Constitutional: Thin elderly Caucasian female currently no acute distress Respiratory: Diminished to auscultation bilaterally with coarse breath sounds, no wheezing, rales, rhonchi or crackles. Normal respiratory effort and patient is not tachypenic. No accessory muscle use.  Unlabored breathing Cardiovascular: RRR, no murmurs / rubs / gallops. S1 and S2 auscultated. No extremity edema.   Abdomen: Soft, non-tender, non-distended. Bowel sounds positive.  GU: Deferred. Musculoskeletal: No clubbing / cyanosis of digits/nails. No joint deformity upper and lower extremities.  Skin: No rashes, lesions, ulcers on limited skin. No induration; Warm and dry.  Neurologic: CN 2-12 grossly intact with no focal deficits. Romberg sign and cerebellar reflexes not assessed.  Psychiatric: Normal judgment and insight. Alert and oriented x 3. Normal mood and appropriate affect.     Condition  at discharge: stable  The results of significant diagnostics from this hospitalization (including imaging, microbiology, ancillary and laboratory) are listed below for reference.   Imaging Studies: DG Chest Portable 1 View  Result Date: 11/27/2021 CLINICAL DATA:  Shortness of breath and chest pain with radiation to the shoulders. Weakness. Nausea and vomiting. EXAM: PORTABLE CHEST 1 VIEW COMPARISON:  11/25/2021 FINDINGS: Lungs are adequately inflated and otherwise clear. Cardiomediastinal silhouette and remainder of the exam is unchanged. IMPRESSION: No active disease. Electronically Signed   By: Marin Olp M.D.   On: 11/27/2021 10:16   DG Chest 2 View  Result Date: 11/10/2021 CLINICAL DATA:  chest pain EXAM: CHEST - 2 VIEW COMPARISON:  Chest x-ray 11/04/2021, CT chest 05/17/2021 FINDINGS: The heart and mediastinal  contours are unchanged. Aortic calcification. Biapical pleural/pulmonary scarring. No focal consolidation. No pulmonary edema. No pleural effusion. No pneumothorax. No acute osseous abnormality. IMPRESSION: 1. No active cardiopulmonary disease. 2.  Aortic Atherosclerosis (ICD10-I70.0). Electronically Signed   By: Tish Frederickson M.D.   On: 11/10/2021 21:50    Microbiology: Results for orders placed or performed during the hospital encounter of 11/27/21  SARS Coronavirus 2 by RT PCR (hospital order, performed in [facility] hospital lab) *cepheid single result test* Anterior Nasal Swab     Status: None   Collection Time: 11/27/21 10:11 AM   Specimen: Anterior Nasal Swab  Result Value Ref Range Status   SARS Coronavirus 2 by RT PCR NEGATIVE NEGATIVE Final    Comment: (NOTE) SARS-CoV-2 target nucleic acids are NOT DETECTED.  The SARS-CoV-2 RNA is generally detectable in upper and lower respiratory specimens during the acute phase of infection. The lowest concentration of SARS-CoV-2 viral copies this assay can detect is 250 copies / mL. A negative result does not preclude  SARS-CoV-2 infection and should not be used as the sole basis for treatment or other patient management decisions.  A negative result may occur with improper specimen collection / handling, submission of specimen other than nasopharyngeal swab, presence of viral mutation(s) within the areas targeted by this assay, and inadequate number of viral copies (<250 copies / mL). A negative result must be combined with clinical observations, patient history, and epidemiological information.  Fact Sheet for Patients:   RoadLapTop.co.za  Fact Sheet for Healthcare Providers: http://kim-miller.com/  This test is not yet approved or  cleared by the [place] FDA and has been authorized for detection and/or diagnosis of SARS-CoV-2 by FDA under an Emergency Use Authorization (EUA).  This EUA will remain in effect (meaning this test can be used) for the duration of the COVID-19 declaration under Section 564(b)(1) of the Act, 21 U.S.C. section 360bbb-3(b)(1), unless the authorization is terminated or revoked sooner.  Performed at [facility], [address] [place], [place] [address]     Labs: CBC: Recent Labs  Lab 11/27/21 0914 11/27/21 1822 11/28/21 0503 11/29/21 0352  WBC 9.6  --  8.1 9.6  NEUTROABS 5.4  --   --  5.2  HGB 6.2* 5.7* 8.3* 8.8*  HCT 18.8* 18.0* 25.2* 26.2*  MCV 100.5*  --  97.3 97.4  PLT 373  --  320 373   Basic Metabolic Panel: Recent Labs  Lab 11/27/21 0914 11/28/21 0503 11/29/21 0352  NA 127* 132* 136  K 4.6 4.3 4.1  CL 99 103 106  CO2 23 22 21*  GLUCOSE 90 86 90  BUN 37* 34* 35*  CREATININE 1.96* 1.97* 2.00*  CALCIUM 8.4* 8.7* 8.9  MG  --   --  2.3  PHOS  --  3.1 3.7   Liver Function Tests: Recent Labs  Lab 11/27/21 0914 11/28/21 0503 11/29/21 0352  AST 16  --  17  ALT 12  --  13  ALKPHOS 63  --  59  BILITOT 0.3  --  0.8  PROT 6.3*  --  6.1*  ALBUMIN 3.2* 3.4* 3.3*   CBG: No  results for input(s): "GLUCAP" in the last 168 hours.  Discharge time spent: greater than 30 minutes.  Signed: Marguerita Merles, DO [facility] 11/29/2021

## 2021-11-29 NOTE — Anesthesia Postprocedure Evaluation (Signed)
Anesthesia Post Note  Patient: Kristina Olson  Procedure(s) Performed: ESOPHAGOGASTRODUODENOSCOPY (EGD) WITH PROPOFOL     Patient location during evaluation: Endoscopy Anesthesia Type: MAC Level of consciousness: awake and alert Pain management: pain level controlled Vital Signs Assessment: post-procedure vital signs reviewed and stable Respiratory status: spontaneous breathing, nonlabored ventilation, respiratory function stable and patient connected to nasal cannula oxygen Cardiovascular status: stable and blood pressure returned to baseline Postop Assessment: no apparent nausea or vomiting Anesthetic complications: no   No notable events documented.  Last Vitals:  Vitals:   11/29/21 1410 11/29/21 1423  BP: (!) 151/50 (!) 151/81  Pulse: 79 86  Resp: 17 18  Temp:  36.9 C  SpO2: 95% 98%    Last Pain:  Vitals:   11/29/21 1341  TempSrc: Temporal  PainSc:                  March Rummage Nyshawn Gowdy

## 2021-11-29 NOTE — Progress Notes (Signed)
PROGRESS NOTE    Kristina Olson  UVO:[id number] DOB: 06-18-1945 DOA: 11/27/2021 PCP: Pcp, No   Brief Narrative:  The patient is a 76 year old elderly Caucasian female with a past medical history significant for but not limited to proximal atrial fibrillation, COPD, tobacco abuse and cigarette smoker, anxiety and depression, stage IIIb chronic kidney disease, colonic polyps as well as other comorbidities who presented to the ED due to dyspnea, chest pressure radiating into her shoulders and progressively worsening generalized weakness with a history of nausea vomiting and lower abdominal pain with recent constipation followed by dark tarry stools several times after she was started on Eliquis for PAF a few weeks ago.  She states that she was on Eliquis previously and then switched to Xarelto but continues to have some dark tarry stools and felt very fatigued and tired.  Denies any other complaints and was brought in and had a hemoglobin of 6.2 with her hemoglobin being 8.2 a few weeks ago.  CMP showed that her sodium was 127 and her BUNs/creatinine was 37/1.96.  GI was consulted and they are planning for an EGD in the a.m.  She is supposed to have an outpatient colonoscopy on December 15, 2021 with digestive health.  GI recommending soft diet today and continue Protonix for now.  Plan is to evaluate her anemia with melena and rule out GI bleed given that her hemoglobin had dropped and will be undergoing EGD.  EGD done today and showed a normal esophagus and gastritis as well as normal duodenal bulb and first portion of the duodenum and second portion of the duodenum.  No specimens were collected.  GI recommending clear liquid diet today and continue present medications and continue to hold anticoagulants and discussing colonoscopy with the patient to be done tomorrow.    Assessment and Plan:  GI bleed with Melena Macrocytic Anemia -Admitted to stepdown/inpatient. -Kept NPO but now EGD being done on 11/29/21 so  GI started her on a Soft Diet and was NPO this AM  -Hgb/Hct went from 6.2/18.8 -> 5.7/18.0 -> 8.3/25.2 after Transfusion of 1 unit of pRBC and is now 8.8/26.2 -IVF now stopped -Continue Pantoprazole gtt and then IV Pantoprazole 40 mg q12h -Monitor H&H. -Transfuse as needed. -FOBT was Negative  -Continue to Monitor for S/Sx of Bleeding -EGD done this AM and showed a normal esophagus with gastritis and a normal duodenal bulb with the first portion of the duodenum and second portion of the duodenum with no specimens collected.  GI recommending clear liquid diet today and continue present medications and discussing colonoscopy with the patient to be done tomorrow -GI recommends continue to hold anticoagulation we will follow with the patient.   Hyponatremia -Seems to be chronic. -Likely contributing to her weakness. -No diuretics on her medication profile. -Was also 127 and improved to 132 yesterday and today is 136 -Checked urine sodium and was 53 and osmolality was 226. -Continue to Monitor and Trend    Grade I diastolic dysfunction -No signs or symptoms of volume overload. -BNP was a little elevated at 289.9 -Holding metoprolol to avoid hypotension. -Continue to Monitor for S/Sx of Volume Overload   Tobacco Abuse and Cigarette smoker -Nicotine replacement therapy as needed. -Smoking Cessastion Counseling given    Stage 3b chronic kidney disease (HCC) Metabolic acidosis -Patient's BUNs/creatinine went from 37/1.96 -> 30/1.97 -> 35/2.00 -The patient has a slight metabolic acidosis with a CO2 of 21, anion gap of 9, chloride level of 106 -Avoid further nephrotoxic medications, contrast  dyes, hypotension and dehydration to ensure adequate renal perfusion and renally adjust medications -Repeat CMP in a.m.   Coronary artery disease due to calcified coronary lesion -Was n.p.o but was placed on a diet yesterday and now on a clear liquid diet and will be n.p.o. for the morning for likely  colonoscopy -Currently aspirin, beta-blocker and DOAC have been held given her concern for GI bleeding .   COPD (chronic obstructive pulmonary disease) (HCC) -Supplemental oxygen and bronchodilators as needed. -Smoking cessation. -SpO2: 98 % O2 Flow Rate (L/min): 6 L/min -Was not wearing any supplemental oxygen via nasal cannula this morning  Hypoalbuminemia -Mild and patient's albumin level went from 3.4 -> 3.3 -Continue to Monitor and Trend -Repeat CMP in the AM    Dyslipidemia -Hold gemfibrozil and fish oils for now. -Resume after EGD but will hold today given that she is on a clear liquid diet and likely undergoing colonoscopy tomorrow   GERD (gastroesophageal reflux disease)/GI Prophylaxis -On PPI gtt.  DVT prophylaxis: SCDs Start: 11/27/21 1701    Code Status: Full Code Family Communication: No family currently at bedside  Disposition Plan:  Level of care: Progressive Status is: Inpatient Remains inpatient appropriate because: Just had EGD and GI is recommending colonoscopy in the morning given unclear source of bleeding   Consultants:  Gastroenterology  Procedures:  EGD Findings:      The examined esophagus was normal.      Patchy minimal inflammation was found in the entire examined stomach.      The exam of the stomach was otherwise normal.      The duodenal bulb, first portion of the duodenum and second portion of       the duodenum were normal.      No old or fresh blood was seen to the extent of our examination. Impression:               - Normal esophagus.                           - Gastritis.                           - Normal duodenal bulb, first portion of the                            duodenum and second portion of the duodenum.                           - No specimens collected. Moderate Sedation:      Not Applicable - Patient had care per Anesthesia. Recommendation:           - Return patient to hospital ward for ongoing care.                            - Clear liquid diet today.                           - Continue present medications.                           - Will discuss colonoscopy with patient, hopefully  to be done tomorrow.                           - Continue to hold anticoagulants.                           [facility] will follow.  Antimicrobials:  Anti-infectives (From admission, onward)    None       Subjective: Seen and examined at bedside prior to her EGD and she is still having bloody bowel movement.  States that she had a dark stool this morning.  No nausea or vomiting.  Felt okay.  No other concerns or complaints at this time.  Objective: Vitals:   11/29/21 1400 11/29/21 1405 11/29/21 1410 11/29/21 1423  BP: 137/60  (!) 151/50 (!) 151/81  Pulse: 81 83 79 86  Resp: 13 (!) $Remo'25 17 18  'ZnlKx$ Temp:    98.5 F (36.9 C)  TempSrc:      SpO2: 97% 97% 95% 98%  Weight:      Height:        Intake/Output Summary (Last 24 hours) at 11/29/2021 1429 Last data filed at 11/29/2021 1343 Gross per 24 hour  Intake 408.59 ml  Output 600 ml  Net -191.41 ml   Filed Weights   11/27/21 0909  Weight: 52.2 kg   Examination: Physical Exam:  Constitutional: Thin elderly Caucasian female currently no acute distress Respiratory: Diminished to auscultation bilaterally with coarse breath sounds, no wheezing, rales, rhonchi or crackles. Normal respiratory effort and patient is not tachypenic. No accessory muscle use.  Unlabored breathing Cardiovascular: RRR, no murmurs / rubs / gallops. S1 and S2 auscultated. No extremity edema.   Abdomen: Soft, non-tender, non-distended. Bowel sounds positive.  GU: Deferred. Musculoskeletal: No clubbing / cyanosis of digits/nails. No joint deformity upper and lower extremities.  Skin: No rashes, lesions, ulcers on limited skin. No induration; Warm and dry.  Neurologic: CN 2-12 grossly intact with no focal deficits. Romberg sign and cerebellar reflexes not assessed.   Psychiatric: Normal judgment and insight. Alert and oriented x 3. Normal mood and appropriate affect.   Data Reviewed: I have personally reviewed following labs and imaging studies  CBC: Recent Labs  Lab 11/27/21 0914 11/27/21 1822 11/28/21 0503 11/29/21 0352  WBC 9.6  --  8.1 9.6  NEUTROABS 5.4  --   --  5.2  HGB 6.2* 5.7* 8.3* 8.8*  HCT 18.8* 18.0* 25.2* 26.2*  MCV 100.5*  --  97.3 97.4  PLT 373  --  320 751   Basic Metabolic Panel: Recent Labs  Lab 11/27/21 0914 11/28/21 0503 11/29/21 0352  NA 127* 132* 136  K 4.6 4.3 4.1  CL 99 103 106  CO2 23 22 21*  GLUCOSE 90 86 90  BUN 37* 34* 35*  CREATININE 1.96* 1.97* 2.00*  CALCIUM 8.4* 8.7* 8.9  MG  --   --  2.3  PHOS  --  3.1 3.7   GFR: Estimated Creatinine Clearance: 18.9 mL/min (A) (by C-G formula based on SCr of 2 mg/dL (H)). Liver Function Tests: Recent Labs  Lab 11/27/21 0914 11/28/21 0503 11/29/21 0352  AST 16  --  17  ALT 12  --  13  ALKPHOS 63  --  59  BILITOT 0.3  --  0.8  PROT 6.3*  --  6.1*  ALBUMIN 3.2* 3.4* 3.3*   Recent Labs  Lab 11/27/21 0914  LIPASE  53*   No results for input(s): "AMMONIA" in the last 168 hours. Coagulation Profile: Recent Labs  Lab 11/27/21 0914  INR 1.2   Cardiac Enzymes: No results for input(s): "CKTOTAL", "CKMB", "CKMBINDEX", "TROPONINI" in the last 168 hours. BNP (last 3 results) No results for input(s): "PROBNP" in the last 8760 hours. HbA1C: No results for input(s): "HGBA1C" in the last 72 hours. CBG: No results for input(s): "GLUCAP" in the last 168 hours. Lipid Profile: No results for input(s): "CHOL", "HDL", "LDLCALC", "TRIG", "CHOLHDL", "LDLDIRECT" in the last 72 hours. Thyroid Function Tests: No results for input(s): "TSH", "T4TOTAL", "FREET4", "T3FREE", "THYROIDAB" in the last 72 hours. Anemia Panel: No results for input(s): "VITAMINB12", "FOLATE", "FERRITIN", "TIBC", "IRON", "RETICCTPCT" in the last 72 hours. Sepsis Labs: No results for  input(s): "PROCALCITON", "LATICACIDVEN" in the last 168 hours.  Recent Results (from the past 240 hour(s))  SARS Coronavirus 2 by RT PCR (hospital order, performed in [facility] hospital lab) *cepheid single result test* Anterior Nasal Swab     Status: None   Collection Time: 11/27/21 10:11 AM   Specimen: Anterior Nasal Swab  Result Value Ref Range Status   SARS Coronavirus 2 by RT PCR NEGATIVE NEGATIVE Final    Comment: (NOTE) SARS-CoV-2 target nucleic acids are NOT DETECTED.  The SARS-CoV-2 RNA is generally detectable in upper and lower respiratory specimens during the acute phase of infection. The lowest concentration of SARS-CoV-2 viral copies this assay can detect is 250 copies / mL. A negative result does not preclude SARS-CoV-2 infection and should not be used as the sole basis for treatment or other patient management decisions.  A negative result may occur with improper specimen collection / handling, submission of specimen other than nasopharyngeal swab, presence of viral mutation(s) within the areas targeted by this assay, and inadequate number of viral copies (<250 copies / mL). A negative result must be combined with clinical observations, patient history, and epidemiological information.  Fact Sheet for Patients:   https://www.patel.info/  Fact Sheet for Healthcare Providers: https://hall.com/  This test is not yet approved or  cleared by the [place] FDA and has been authorized for detection and/or diagnosis of SARS-CoV-2 by FDA under an Emergency Use Authorization (EUA).  This EUA will remain in effect (meaning this test can be used) for the duration of the COVID-19 declaration under Section 564(b)(1) of the Act, 21 U.S.C. section 360bbb-3(b)(1), unless the authorization is terminated or revoked sooner.  Performed at [facility], [address], [place], [place] [address]     Radiology Studies: No  results found.  Scheduled Meds:  LORazepam  0.5-1 mg Oral QHS   [START ON 11/30/2021] pantoprazole  40 mg Intravenous Q12H   peg 3350 powder  0.5 kit Oral Once   And   [START ON 11/30/2021] peg 3350 powder  0.5 kit Oral Once   Continuous Infusions:  pantoprazole Stopped (11/29/21 1225)    LOS: 2 days   Raiford Noble, DO [facility] Available via Epic secure chat 7am-7pm After these hours, please refer to coverage provider listed on [email or web address] 11/29/2021, 2:29 PM

## 2021-11-29 NOTE — Anesthesia Procedure Notes (Signed)
Procedure Name: MAC Date/Time: 11/29/2021 1:26 PM  Performed by: Lollie Sails, CRNAPre-anesthesia Checklist: Patient identified, Emergency Drugs available, Suction available, Patient being monitored and Timeout performed Oxygen Delivery Method: Nasal cannula Placement Confirmation: positive ETCO2

## 2021-11-29 NOTE — Plan of Care (Signed)
Problem: Health Behavior/Discharge Planning: Goal: Ability to manage health-related needs will improve 11/29/2021 1624 by Annie Sable, RN Outcome: Completed/Met 11/29/2021 0846 by Annie Sable, RN Outcome: Progressing   Problem: Clinical Measurements: Goal: Ability to maintain clinical measurements within normal limits will improve 11/29/2021 1624 by Annie Sable, RN Outcome: Completed/Met 11/29/2021 0846 by Annie Sable, RN Outcome: Progressing Goal: Will remain free from infection 11/29/2021 1624 by Annie Sable, RN Outcome: Completed/Met 11/29/2021 0846 by Annie Sable, RN Outcome: Progressing Goal: Diagnostic test results will improve 11/29/2021 1624 by Annie Sable, RN Outcome: Completed/Met 11/29/2021 0846 by Annie Sable, RN Outcome: Progressing Goal: Respiratory complications will improve 11/29/2021 1624 by Annie Sable, RN Outcome: Completed/Met 11/29/2021 0846 by Annie Sable, RN Outcome: Progressing Goal: Cardiovascular complication will be avoided 11/29/2021 1624 by Annie Sable, RN Outcome: Completed/Met 11/29/2021 0846 by Annie Sable, RN Outcome: Progressing   Problem: Activity: Goal: Risk for activity intolerance will decrease 11/29/2021 1624 by Annie Sable, RN Outcome: Completed/Met 11/29/2021 0846 by Annie Sable, RN Outcome: Progressing   Problem: Nutrition: Goal: Adequate nutrition will be maintained 11/29/2021 1624 by Annie Sable, RN Outcome: Completed/Met 11/29/2021 0846 by Annie Sable, RN Outcome: Progressing   Problem: Coping: Goal: Level of anxiety will decrease 11/29/2021 1624 by Annie Sable, RN Outcome: Completed/Met 11/29/2021 0846 by Annie Sable, RN Outcome: Progressing   Problem: Elimination: Goal: Will not experience complications related to bowel motility 11/29/2021 1624 by Annie Sable, RN Outcome: Completed/Met 11/29/2021 0846 by Annie Sable, RN Outcome: Progressing    Problem: Pain Managment: Goal: General experience of comfort will improve 11/29/2021 1624 by Annie Sable, RN Outcome: Completed/Met 11/29/2021 0846 by Annie Sable, RN Outcome: Adequate for Discharge   Problem: Safety: Goal: Ability to remain free from injury will improve 11/29/2021 1624 by Annie Sable, RN Outcome: Completed/Met 11/29/2021 0846 by Annie Sable, RN Outcome: Progressing   Problem: Education: Goal: Knowledge of General Education information will improve Description: Including pain rating scale, medication(s)/side effects and non-pharmacologic comfort measures Outcome: Completed/Met   Problem: Health Behavior/Discharge Planning: Goal: Ability to manage health-related needs will improve Outcome: Completed/Met   Problem: Clinical Measurements: Goal: Ability to maintain clinical measurements within normal limits will improve Outcome: Completed/Met Goal: Will remain free from infection Outcome: Completed/Met Goal: Diagnostic test results will improve Outcome: Completed/Met Goal: Respiratory complications will improve Outcome: Completed/Met Goal: Cardiovascular complication will be avoided Outcome: Completed/Met   Problem: Activity: Goal: Risk for activity intolerance will decrease Outcome: Completed/Met   Problem: Nutrition: Goal: Adequate nutrition will be maintained Outcome: Completed/Met   Problem: Coping: Goal: Level of anxiety will decrease Outcome: Completed/Met   Problem: Elimination: Goal: Will not experience complications related to bowel motility Outcome: Completed/Met Goal: Will not experience complications related to urinary retention Outcome: Completed/Met   Problem: Pain Managment: Goal: General experience of comfort will improve Outcome: Completed/Met   Problem: Safety: Goal: Ability to remain free from injury will improve Outcome: Completed/Met   Problem: Skin Integrity: Goal: Risk for impaired skin integrity will  decrease Outcome: Completed/Met   Problem: Education: Goal: Ability to identify signs and symptoms of gastrointestinal bleeding will improve 11/29/2021 1624 by Annie Sable, RN Outcome: Completed/Met 11/29/2021 0847 by Annie Sable, RN Outcome: Progressing   Problem: Bowel/Gastric: Goal: Will show no signs and symptoms of gastrointestinal bleeding 11/29/2021 1624 by Annie Sable, RN Outcome: Completed/Met 11/29/2021 0847 by Annie Sable,  RN Outcome: Progressing   Problem: Fluid Volume: Goal: Will show no signs and symptoms of excessive bleeding 11/29/2021 1624 by Annie Sable, RN Outcome: Completed/Met 11/29/2021 0847 by Annie Sable, RN Outcome: Progressing   Problem: Clinical Measurements: Goal: Complications related to the disease process, condition or treatment will be avoided or minimized 11/29/2021 1624 by Annie Sable, RN Outcome: Completed/Met 11/29/2021 0847 by Annie Sable, RN Outcome: Progressing

## 2021-11-29 NOTE — TOC Initial Note (Signed)
Transition of Care [facility]) - Initial/Assessment Note    Patient Details  Name: Kristina Olson MRN: [id number] Date of Birth: 1946/04/16  [facility] CM/SW Contact:    Golda Acre, RN Phone Number: 11/29/2021, 9:22 AM  Clinical Narrative:                  Transition of Care [facility]) Screening Note   Patient Details  Name: Kristina Olson Date of Birth: Apr 17, 1946   Transition of Care [facility]) CM/SW Contact:    Golda Acre, RN Phone Number: 11/29/2021, 9:23 AM    Transition of Care Department [facility]) has reviewed patient and no TOC needs have been identified at this time. We will continue to monitor patient advancement through interdisciplinary progression rounds. If new patient transition needs arise, please place a TOC consult.    Expected Discharge Plan: Home/Self Care Barriers to Discharge: Continued Medical Work up   Patient Goals and CMS Choice Patient states their goals for this hospitalization and ongoing recovery are:: to go back home CMS Medicare.gov Compare Post Acute Care list provided to:: Patient    Expected Discharge Plan and Services Expected Discharge Plan: Home/Self Care   Discharge Planning Services: CM Consult   Living arrangements for the past 2 months: Single Family Home                                      Prior Living Arrangements/Services Living arrangements for the past 2 months: Single Family Home Lives with:: Self Patient language and need for interpreter reviewed:: Yes Do you feel safe going back to the place where you live?: Yes      Need for Family Participation in Patient Care: No (Comment) Care giver support system in place?: No (comment)   Criminal Activity/Legal Involvement Pertinent to Current Situation/Hospitalization: No - Comment as needed  Activities of Daily Living Home Assistive Devices/Equipment: Dentures (specify type) ADL Screening (condition at time of admission) Patient's cognitive ability  adequate to safely complete daily activities?: Yes Is the patient deaf or have difficulty hearing?: No Does the patient have difficulty seeing, even when wearing glasses/contacts?: No Does the patient have difficulty concentrating, remembering, or making decisions?: No Patient able to express need for assistance with ADLs?: Yes Does the patient have difficulty dressing or bathing?: No Independently performs ADLs?: Yes (appropriate for developmental age) Does the patient have difficulty walking or climbing stairs?: Yes Weakness of Legs: Both Weakness of Arms/Hands: None  Permission Sought/Granted                  Emotional Assessment Appearance:: Appears stated age     Orientation: : Oriented to Self, Oriented to Place, Oriented to  Time, Oriented to Situation Alcohol / Substance Use: Not Applicable Psych Involvement: No (comment)  Admission diagnosis:  Shortness of breath [R06.02] Melena [K92.1] Hyponatremia [E87.1] GI bleed [K92.2] Symptomatic anemia [D64.9] Chest pain, unspecified type [R07.9] Patient Active Problem List   Diagnosis Date Noted   GI bleed 11/27/2021   Hyponatremia 11/27/2021   Iron deficiency anemia due to chronic blood loss 11/19/2021   Bronchitis 11/04/2021   Coronary artery disease due to calcified coronary lesion 05/20/2021   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   Neck mass 02/04/2020   Early stage nonexudative age-related macular degeneration of both eyes 10/22/2019   Cigarette smoker 04/12/2019   Lymphadenitis 04/12/2019   Grade I diastolic dysfunction 03/19/2019   Lumbar spondylosis  09/13/2018   GERD (gastroesophageal reflux disease) 08/30/2017   Tinnitus of both ears 08/30/2017   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 [place] [address] Phone: [phone] Fax: [phone]     Social Determinants of Health (SDOH) Interventions    Readmission Risk Interventions     No data to display

## 2021-11-29 NOTE — Progress Notes (Signed)
Pt discharged home today per Dr. Marland Mcalpine. Pt's IV site D/C'd and WDL. Pt's VSS. Pt provided with home medication list, discharge instructions and prescriptions. Verbalized understanding. Pt currently awaiting family to pickup from hospital.

## 2021-11-29 NOTE — Plan of Care (Signed)
  Problem: Health Behavior/Discharge Planning: Goal: Ability to manage health-related needs will improve Outcome: Progressing   Problem: Clinical Measurements: Goal: Ability to maintain clinical measurements within normal limits will improve Outcome: Progressing Goal: Will remain free from infection Outcome: Progressing Goal: Diagnostic test results will improve Outcome: Progressing Goal: Respiratory complications will improve Outcome: Progressing Goal: Cardiovascular complication will be avoided Outcome: Progressing   Problem: Activity: Goal: Risk for activity intolerance will decrease Outcome: Progressing   Problem: Nutrition: Goal: Adequate nutrition will be maintained Outcome: Progressing   Problem: Coping: Goal: Level of anxiety will decrease Outcome: Progressing   Problem: Elimination: Goal: Will not experience complications related to bowel motility Outcome: Progressing   Problem: Safety: Goal: Ability to remain free from injury will improve Outcome: Progressing   Problem: Education: Goal: Ability to identify signs and symptoms of gastrointestinal bleeding will improve Outcome: Progressing   Problem: Bowel/Gastric: Goal: Will show no signs and symptoms of gastrointestinal bleeding Outcome: Progressing   Problem: Fluid Volume: Goal: Will show no signs and symptoms of excessive bleeding Outcome: Progressing   Problem: Clinical Measurements: Goal: Complications related to the disease process, condition or treatment will be avoided or minimized Outcome: Progressing   Problem: Pain Managment: Goal: General experience of comfort will improve Outcome: Adequate for Discharge

## 2021-11-29 NOTE — Transfer of Care (Signed)
Immediate Anesthesia Transfer of Care Note  Patient: Kristina Olson  Procedure(s) Performed: ESOPHAGOGASTRODUODENOSCOPY (EGD) WITH PROPOFOL  Patient Location: PACU and Endoscopy Unit  Anesthesia Type:MAC  Level of Consciousness: awake, alert  and patient cooperative  Airway & Oxygen Therapy: Patient Spontanous Breathing and Patient connected to nasal cannula oxygen  Post-op Assessment: Report given to RN, Post -op Vital signs reviewed and stable and B/P 132/84  Post vital signs: Reviewed and stable  Last Vitals:  Vitals Value Taken Time  BP    Temp    Pulse 84 11/29/21 1342  Resp 17 11/29/21 1342  SpO2 99 % 11/29/21 1342  Vitals shown include unvalidated device data.  Last Pain:  Vitals:   11/29/21 0745  TempSrc:   PainSc: 0-No pain      Patients Stated Pain Goal: 3 (31/54/00 8676)  Complications: No notable events documented.

## 2021-11-30 ENCOUNTER — Encounter (HOSPITAL_COMMUNITY): Payer: Self-pay | Admitting: Gastroenterology

## 2021-11-30 SURGERY — COLONOSCOPY
Anesthesia: Monitor Anesthesia Care

## 2023-02-04 IMAGING — CR DG CHEST 2V
2 series · 2 of 2 positions shown · non-contrast
Comparison: Chest x-ray 11/04/2021, CT chest 05/17/2021

CLINICAL DATA: chest pain

EXAM:
CHEST - 2 VIEW

[w chest pa]
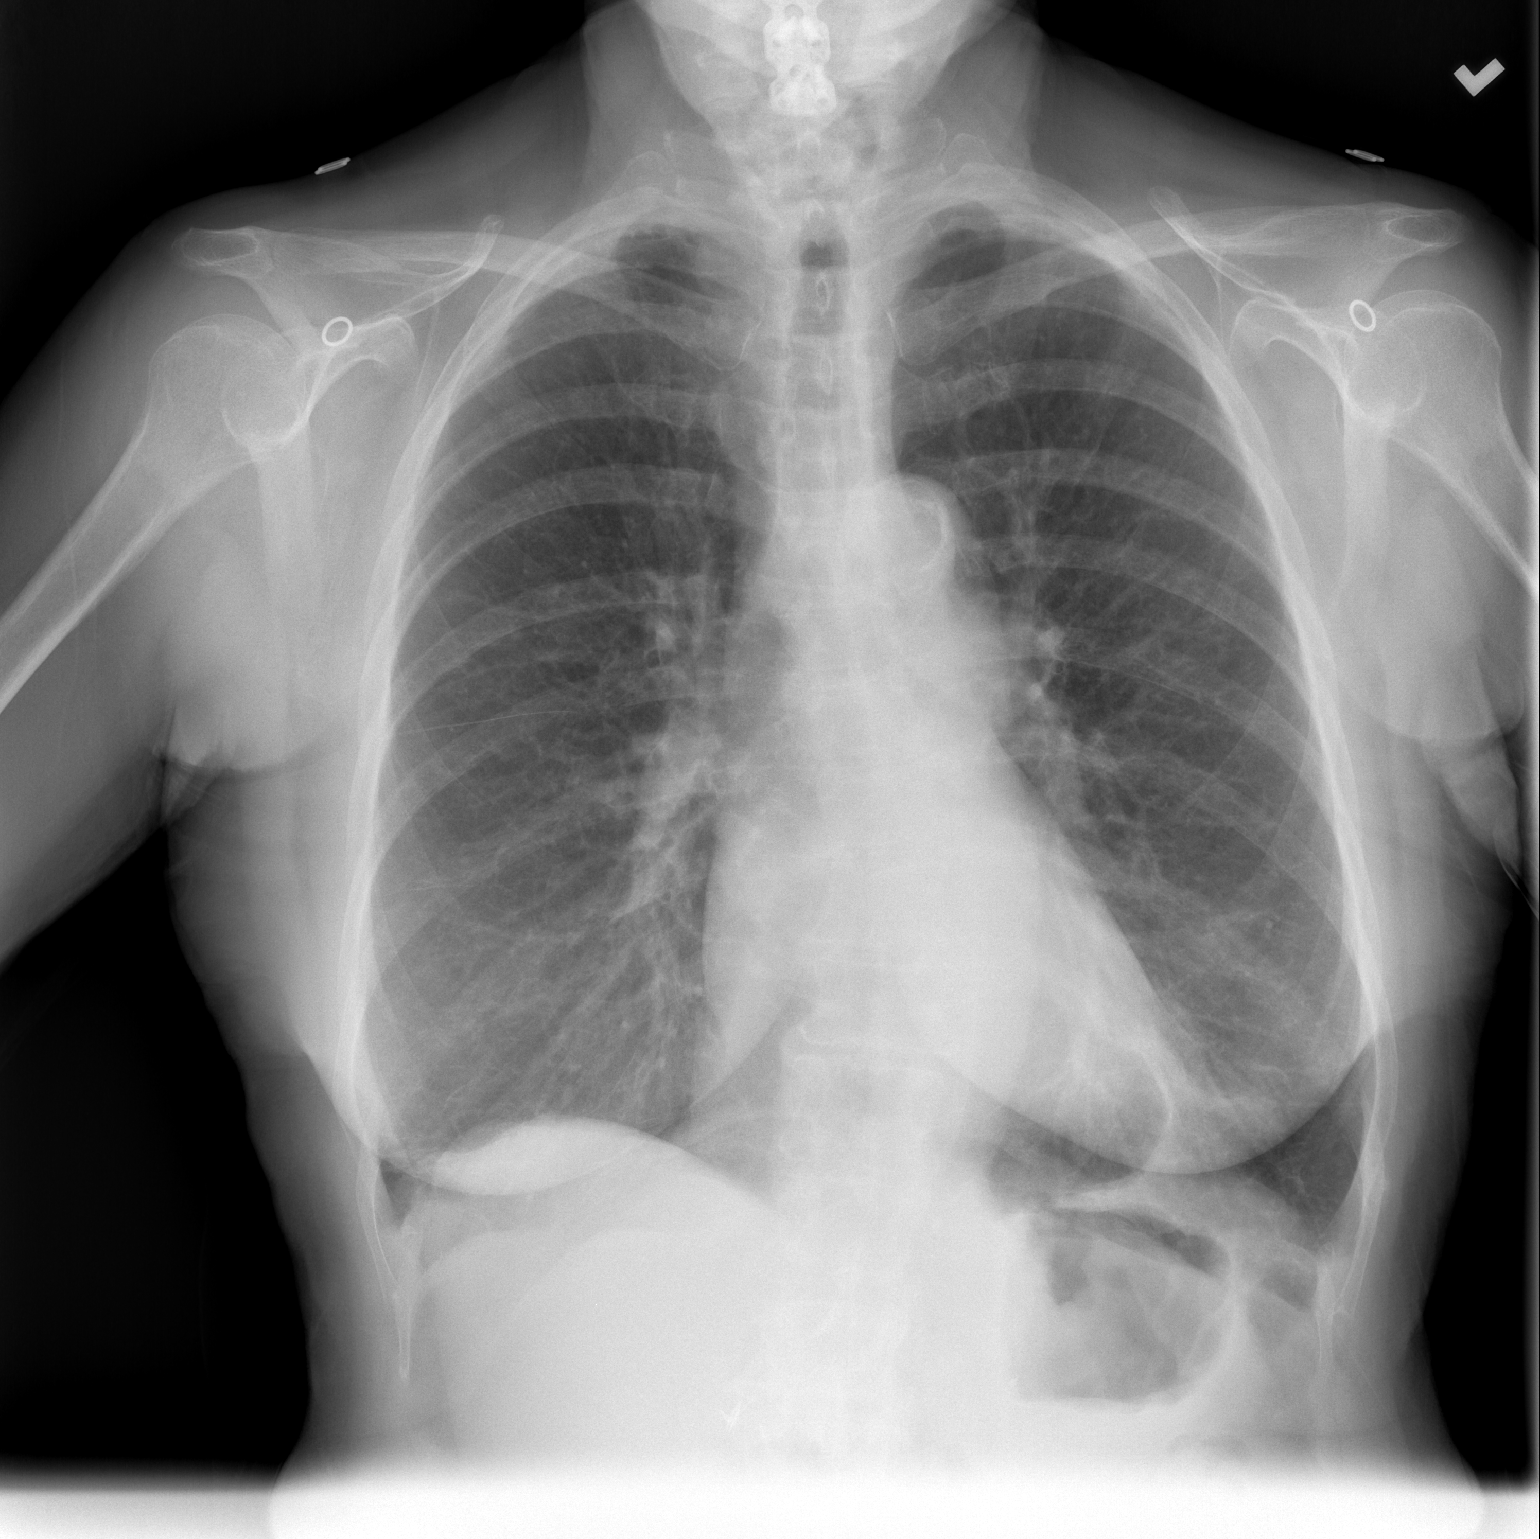

[w chest lat]
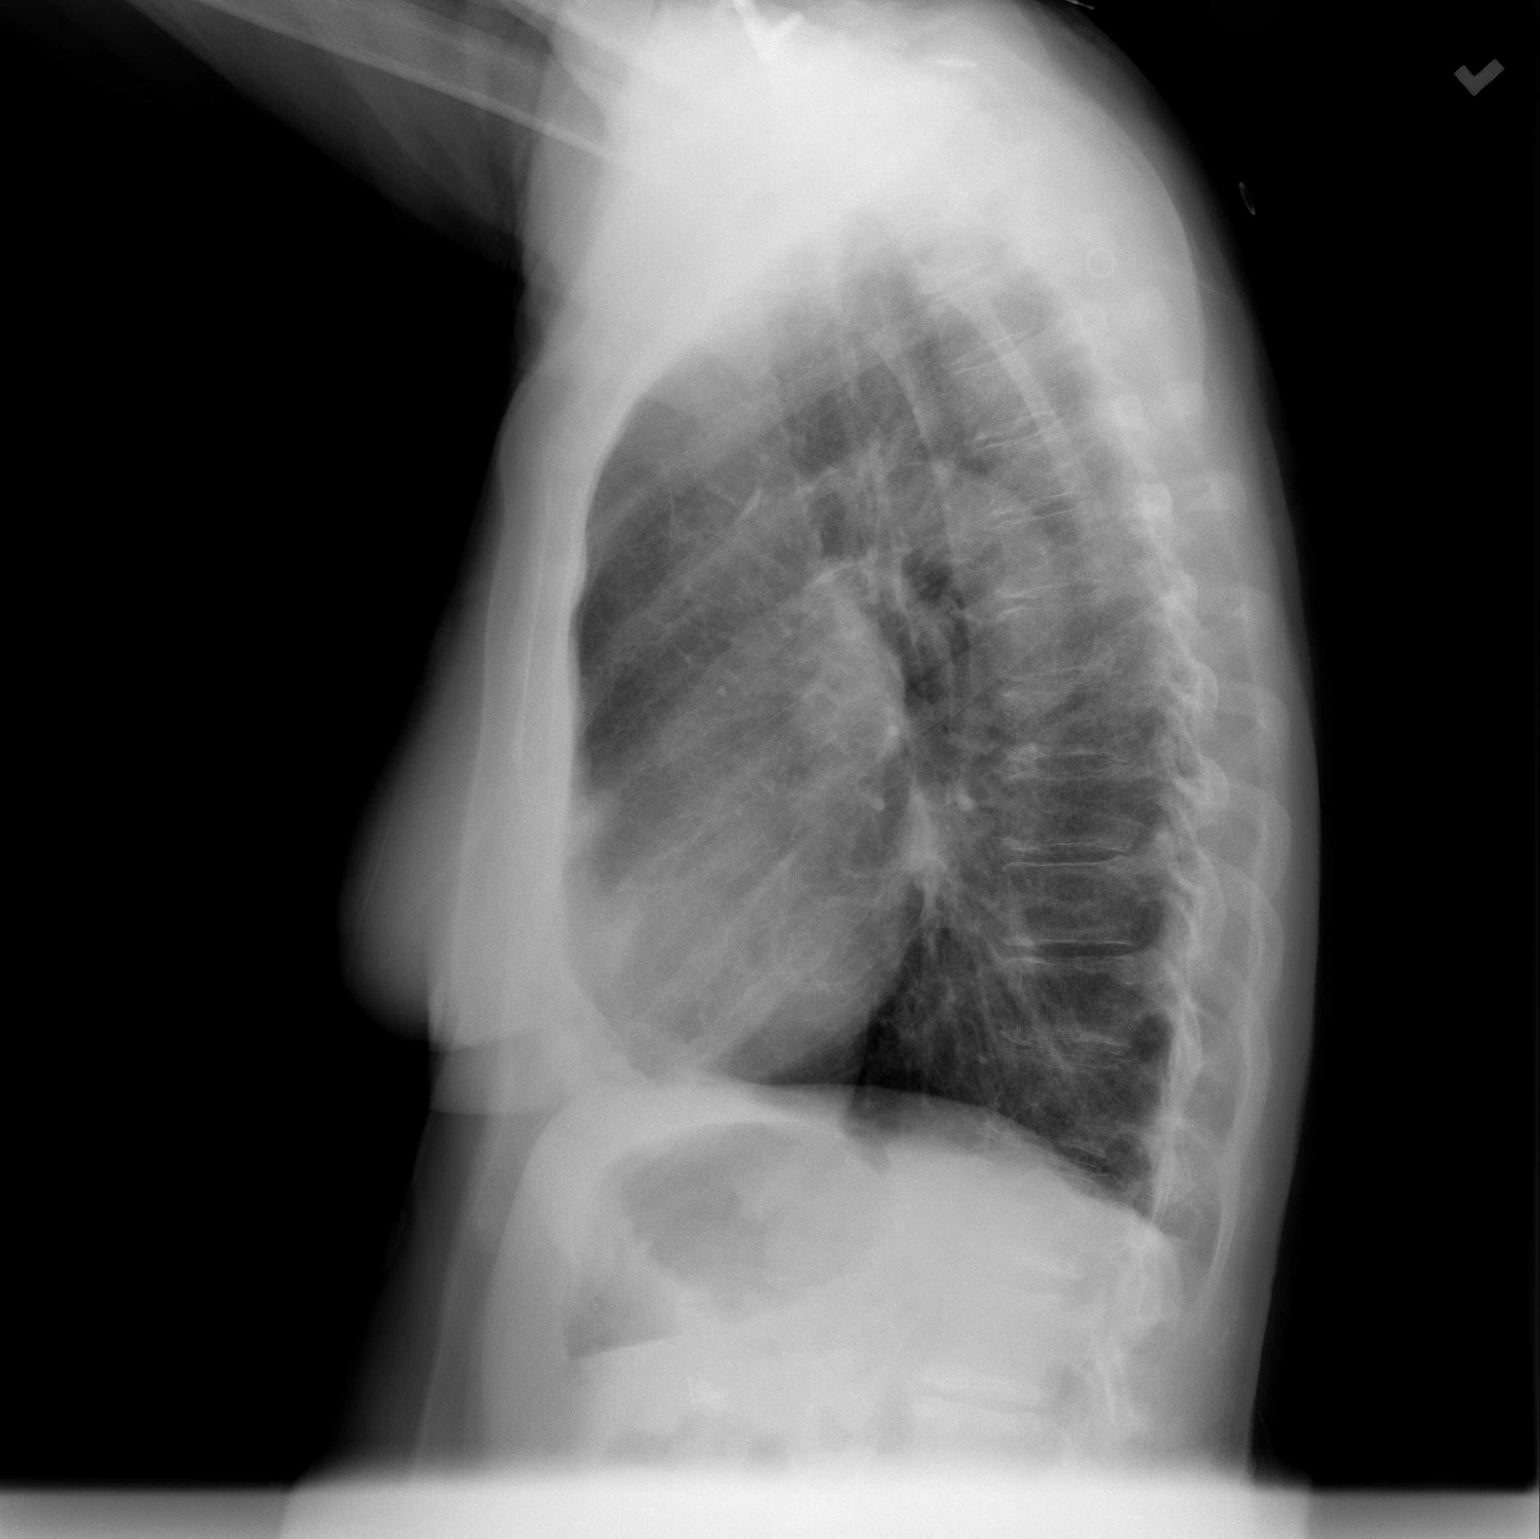

[2 of 2 positions shown; findings below may reference images not displayed]

FINDINGS: The heart and mediastinal contours are unchanged. Aortic
calcification.

Biapical pleural/pulmonary scarring. No focal consolidation. No
pulmonary edema. No pleural effusion. No pneumothorax.

No acute osseous abnormality.
IMPRESSION: 1. No active cardiopulmonary disease.
2.  Aortic Atherosclerosis ([id number]).

## 2023-02-21 IMAGING — DX DG CHEST 1V PORT
1 series · 1 of 1 positions shown · non-contrast
Comparison: 11/25/2021

CLINICAL DATA: Shortness of breath and chest pain with radiation to
the shoulders. Weakness. Nausea and vomiting.

EXAM:
PORTABLE CHEST 1 VIEW

[chest ap]
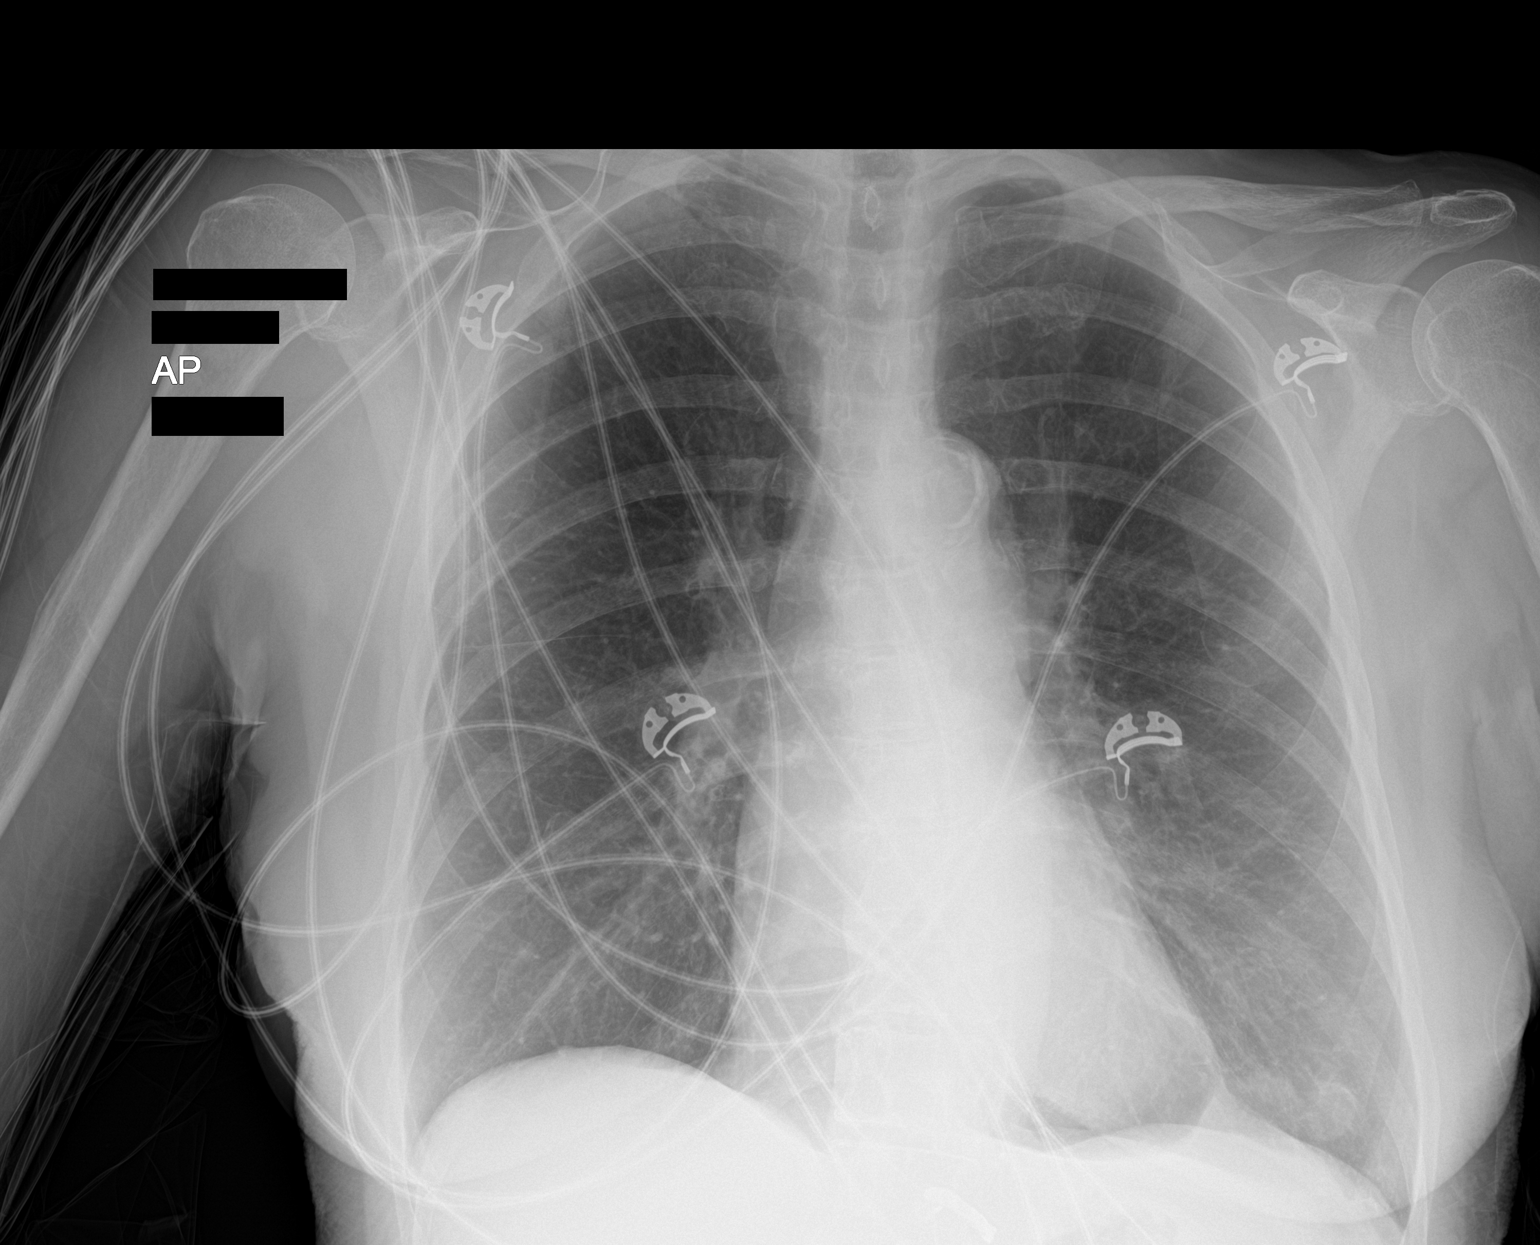

[1 of 1 positions shown; findings below may reference images not displayed]

FINDINGS: Lungs are adequately inflated and otherwise clear. Cardiomediastinal
silhouette and remainder of the exam is unchanged.
IMPRESSION: No active disease.
